# Patient Record
Sex: Female | Born: 1970 | Hispanic: No | Marital: Single | Smoking: Current every day smoker
Health system: Southern US, Community
[De-identification: ages and names within clinical notes are randomized; demographics above are authoritative.]

## PROBLEM LIST (undated history)

## (undated) DIAGNOSIS — F419 Anxiety disorder, unspecified: Secondary | ICD-10-CM

## (undated) DIAGNOSIS — I428 Other cardiomyopathies: Secondary | ICD-10-CM

## (undated) DIAGNOSIS — D649 Anemia, unspecified: Secondary | ICD-10-CM

## (undated) DIAGNOSIS — B3322 Viral myocarditis: Secondary | ICD-10-CM

## (undated) DIAGNOSIS — I10 Essential (primary) hypertension: Secondary | ICD-10-CM

## (undated) DIAGNOSIS — K861 Other chronic pancreatitis: Secondary | ICD-10-CM

## (undated) DIAGNOSIS — D7389 Other diseases of spleen: Secondary | ICD-10-CM

## (undated) DIAGNOSIS — E785 Hyperlipidemia, unspecified: Secondary | ICD-10-CM

## (undated) DIAGNOSIS — E538 Deficiency of other specified B group vitamins: Secondary | ICD-10-CM

## (undated) DIAGNOSIS — Z9289 Personal history of other medical treatment: Secondary | ICD-10-CM

## (undated) DIAGNOSIS — D735 Infarction of spleen: Secondary | ICD-10-CM

## (undated) HISTORY — DX: Hyperlipidemia, unspecified: E78.5

## (undated) HISTORY — DX: Other cardiomyopathies: I42.8

## (undated) HISTORY — DX: Personal history of other medical treatment: Z92.89

## (undated) HISTORY — DX: Viral myocarditis: B33.22

## (undated) HISTORY — DX: Anemia, unspecified: D64.9

## (undated) HISTORY — DX: Infarction of spleen: D73.5

## (undated) HISTORY — PX: CHOLECYSTECTOMY: SHX55

## (undated) HISTORY — DX: Other diseases of spleen: D73.89

## (undated) HISTORY — DX: Essential (primary) hypertension: I10

## (undated) HISTORY — DX: Deficiency of other specified B group vitamins: E53.8

## (undated) HISTORY — DX: Anxiety disorder, unspecified: F41.9

## (undated) HISTORY — DX: Other chronic pancreatitis: K86.1

---
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
## 1988-12-29 HISTORY — PX: WISDOM TOOTH EXTRACTION: SHX21

## 2016-03-17 LAB — LIPID PANEL
CHOLESTEROL: 314 — AB (ref 0–200)
HDL: 56 (ref 35–70)
Triglycerides: 656 — AB (ref 40–160)

## 2016-03-17 LAB — BASIC METABOLIC PANEL
BUN: 11 (ref 4–21)
Creatinine: 0.5 (ref 0.5–1.1)
GLUCOSE: 97
Potassium: 4.3 (ref 3.4–5.3)
Sodium: 144 (ref 137–147)

## 2016-03-17 LAB — HEPATIC FUNCTION PANEL
ALK PHOS: 44 (ref 25–125)
ALT: 19 (ref 7–35)
AST: 17 (ref 13–35)
Bilirubin, Total: 0.7

## 2016-09-22 LAB — LIPID PANEL: CHOLESTEROL: 400 — AB (ref 0–200)

## 2016-09-22 LAB — BASIC METABOLIC PANEL
BUN: 11 (ref 4–21)
Creatinine: 0.5 (ref 0.5–1.1)
Glucose: 104
POTASSIUM: 4 (ref 3.4–5.3)
SODIUM: 138 (ref 137–147)

## 2016-09-22 LAB — HEPATIC FUNCTION PANEL
ALT: 18 (ref 7–35)
AST: 15 (ref 13–35)
Alkaline Phosphatase: 49 (ref 25–125)
BILIRUBIN, TOTAL: 0.5

## 2017-02-19 ENCOUNTER — Encounter: Payer: Self-pay | Admitting: Registered Nurse

## 2017-02-19 ENCOUNTER — Ambulatory Visit: Payer: Self-pay | Admitting: Registered Nurse

## 2017-02-19 VITALS — BP 140/82 | HR 82 | Temp 98.4°F

## 2017-02-19 DIAGNOSIS — J019 Acute sinusitis, unspecified: Secondary | ICD-10-CM

## 2017-02-19 NOTE — Progress Notes (Signed)
0940: Pt c/o non-productive cough, itchy, red, watery eyes, nasal congestion x4 days. Afebrile. Believes sx allergy related. Recently moved to [place] from [facility]. No hx allergies. Started taking Zyrtec 2 days ago. Given 25mg  Benadryl for acute sx relief. Also given bottle saline nasal spray. Hypertensive currently at 140/90. Took meds 2 hours ago. Appt made to see NP this afternoon.  1230: Feels better Benadryl, just a little more tired. Hx reviewed. No acute problems r/t lengthy Hx (viral NICM, Pacreatitis, hyperlipidemia)

## 2017-02-19 NOTE — Patient Instructions (Addendum)
Nasal saline in the shower BID aggressive use and then prn 2 sprays each nostril every 2 hours while awake as needed for congestion flonase 1 spray each nostril twice a day Consider allegra/zyrtec/claritin OTC by mouth daily Trial phenylephrine 5mg  by mouth every 4-6hours as needed for runny nose If worsening sinus headache/ear pain after 48 hours fluticasone/saline nasal/phenylephrine then start amoxicillin 875mg  by mouth twice a day dispensed from [facility]

## 2017-02-19 NOTE — Progress Notes (Signed)
Subjective:    Patient ID: Joyce Cline, female    DOB: 1971-05-17, 46 y.o.   MRN: [id number]  Caucasian 45y/o female here for evaluation rhinitis, cough, sinus pressure.  Denied history of seasonal allergies but moved from [facility] to [place] last October this is her first spring in [place] after 14 years in [place].  Denied fever/chills/nausea/vomiting/rash/diarrhea/chest pain.  Intermittent headache.  Sputum clear.      Review of Systems  Constitutional: Negative for activity change, appetite change, chills, diaphoresis, fatigue, fever and unexpected weight change.  HENT: Positive for congestion, postnasal drip, rhinorrhea, sinus pain, sinus pressure, sneezing and sore throat. Negative for dental problem, drooling, ear discharge, ear pain, facial swelling, hearing loss, mouth sores, nosebleeds, tinnitus, trouble swallowing and voice change.   Eyes: Negative for photophobia, pain, discharge, redness, itching and visual disturbance.  Respiratory: Positive for cough. Negative for choking, chest tightness, shortness of breath, wheezing and stridor.   Cardiovascular: Negative for chest pain, palpitations and leg swelling.  Gastrointestinal: Negative for abdominal distention, abdominal pain, blood in stool, constipation, diarrhea, nausea and vomiting.  Endocrine: Negative for cold intolerance and heat intolerance.  Genitourinary: Negative for difficulty urinating, dysuria and hematuria.  Musculoskeletal: Negative for arthralgias, back pain, gait problem, joint swelling, myalgias, neck pain and neck stiffness.  Skin: Negative for color change, pallor, rash and wound.  Allergic/Immunologic: Negative for environmental allergies and food allergies.  Neurological: Positive for headaches. Negative for dizziness, tremors, seizures, syncope, facial asymmetry, speech difficulty, weakness, light-headedness and numbness.  Hematological: Negative for adenopathy. Does not bruise/bleed easily.  Psychiatric/Behavioral:  Negative for agitation, behavioral problems, confusion and sleep disturbance.       Objective:   Physical Exam  Constitutional: She is oriented to person, place, and time. She appears well-developed and well-nourished. She is active and cooperative.  Non-toxic appearance. She does not have a sickly appearance. She does not appear ill. No distress.  HENT:  Head: Normocephalic and atraumatic.  Right Ear: Hearing, external ear and ear canal normal. A middle ear effusion is present.  Left Ear: Hearing, external ear and ear canal normal. A middle ear effusion is present.  Nose: Mucosal edema and rhinorrhea present. No nose lacerations, sinus tenderness, nasal deformity, septal deviation or nasal septal hematoma. No epistaxis.  No foreign bodies. Right sinus exhibits no maxillary sinus tenderness and no frontal sinus tenderness. Left sinus exhibits no maxillary sinus tenderness and no frontal sinus tenderness.  Mouth/Throat: Uvula is midline and mucous membranes are normal. Mucous membranes are not pale, not dry and not cyanotic. She does not have dentures. No oral lesions. No trismus in the jaw. Normal dentition. No dental abscesses, uvula swelling, lacerations or dental caries. Posterior oropharyngeal edema and posterior oropharyngeal erythema present. No oropharyngeal exudate or tonsillar abscesses.  Mild pressure with sinus palpation denied pain; cobblestoning posterior pharynx mild oropharynx macular erythema; bilateral nasal turbinates edema/erythema clear/white discharge; bilateral TMs with air fluid level clear; bilateral allergic shiners  Eyes: Conjunctivae, EOM and lids are normal. Pupils are equal, round, and reactive to light. Right eye exhibits no chemosis, no discharge, no exudate and no hordeolum. No foreign body present in the right eye. Left eye exhibits no chemosis, no discharge, no exudate and no hordeolum. No foreign body present in the left eye. Right conjunctiva is not injected. Right  conjunctiva has no hemorrhage. Left conjunctiva is not injected. Left conjunctiva has no hemorrhage. No scleral icterus. Right eye exhibits normal extraocular motion and no nystagmus. Left eye exhibits normal extraocular  motion and no nystagmus. Right pupil is round and reactive. Left pupil is round and reactive. Pupils are equal.  Neck: Trachea normal and normal range of motion. Neck supple. No tracheal tenderness, no spinous process tenderness and no muscular tenderness present. No neck rigidity. No tracheal deviation, no edema, no erythema and normal range of motion present. No thyroid mass and no thyromegaly present.  Cardiovascular: Normal rate, regular rhythm, S1 normal, S2 normal, normal heart sounds and intact distal pulses.  PMI is not displaced.  Exam reveals no gallop and no friction rub.   No murmur heard. Pulmonary/Chest: Effort normal and breath sounds normal. No accessory muscle usage or stridor. No respiratory distress. She has no decreased breath sounds. She has no wheezes. She has no rhonchi. She has no rales. She exhibits no tenderness.  Speaks full sentences no cough observed  Abdominal: Soft. She exhibits no distension.  Musculoskeletal: Normal range of motion. She exhibits no edema or tenderness.       Right shoulder: Normal.       Left shoulder: Normal.       Right hip: Normal.       Left hip: Normal.       Right knee: Normal.       Left knee: Normal.       Cervical back: Normal.       Right hand: Normal.       Left hand: Normal.  Lymphadenopathy:       Head (right side): No submental, no submandibular, no tonsillar, no preauricular, no posterior auricular and no occipital adenopathy present.       Head (left side): No submental, no submandibular, no tonsillar, no preauricular, no posterior auricular and no occipital adenopathy present.    She has no cervical adenopathy.       Right cervical: No superficial cervical, no deep cervical and no posterior cervical adenopathy  present.      Left cervical: No superficial cervical, no deep cervical and no posterior cervical adenopathy present.  Neurological: She is alert and oriented to person, place, and time. She has normal strength. She is not disoriented. She displays no atrophy and no tremor. No cranial nerve deficit or sensory deficit. She exhibits normal muscle tone. She displays no seizure activity. Coordination and gait normal. GCS eye subscore is 4. GCS verbal subscore is 5. GCS motor subscore is 6.  Skin: Skin is warm, dry and intact. No abrasion, no bruising, no burn, no ecchymosis, no laceration, no lesion, no petechiae and no rash noted. She is not diaphoretic. No cyanosis or erythema. No pallor. Nails show no clubbing.  Psychiatric: She has a normal mood and affect. Her speech is normal and behavior is normal. Judgment and thought content normal. Cognition and memory are normal.  Nursing note and vitals reviewed.         Assessment & Plan:  A-acute rhinosinusitis  Suspect viral illness as many coworkers sick with similar symptoms but spring bloom in full local area could also be allergies will treat for both with flonase/saline/zyrtec/shower twice a day.  Consider singulair 10mg  po qhs if no improvement with current plan of care x 1 week.  Patient verbalized understanding information/instructions, agreed with plan of care and had no further questions at this time.  Supportive treatment.   No evidence of invasive bacterial infection, non toxic and well hydrated.  This is most likely self limiting viral infection.  I do not see where any further testing or imaging is necessary  at this time.   I will suggest supportive care, rest, good hygiene and encourage the patient to take adequate fluids.  The patient is to return to clinic or EMERGENCY ROOM if symptoms worsen or change significantly e.g. ear pain, fever, purulent discharge from ears or bleeding.  Start amoxicillin 875mg  po BID x 10 days #20 RF0 from [facility]  if worsening ear pain/discharge/fever.  Patient verbalized agreement and understanding of treatment plan and had no further questions at this time     Suspect Viral illness: no evidence of invasive bacterial infection, non toxic and well hydrated.  This is most likely self limiting viral infection.  I do not see where any further testing or imaging is necessary at this time.   I will suggest supportive care, rest, good hygiene and encourage the patient to take adequate fluids.  Does not require work excuse.   Avoid Sudafed due to hypertension.  May continue mucinex if chest congestion.  Phenylephrine 5mg  po q4-6 prn rhinitis given 4 packages from clinic stock.  Discussed with patient can cause drowsiness.  Recommend tylenol 1000mg  po QID prn pain/fever as motrin/advil/aleve/naproxen can elevate blood pressure/counteract her medications.nasal saline 1-2 sprays each nostril prn q2h, motrin 800mg  po TID prn.  Discussed honey with lemon and salt water gargles for comfort also.  The patient is to return to clinic or EMERGENCY ROOM if symptoms worsen or change significantly e.g. fever, lethargy, SOB, wheezing.  Patient verbalized agreement and understanding of treatment plan.    start flonase 1 spray each nostril BID #1 RF0 Rx given from [facility], saline 2 sprays each nostril q2h prn congestion given 1 UD bottle from clinic stock.  If no improvement with 48 hours of saline and flonase use start amoxicillin 875mg  po BID x 10 days.  Rx given from [facility] .  No evidence of systemic bacterial infection, non toxic and well hydrated.  I do not see where any further testing or imaging is necessary at this time.   I will suggest supportive care, rest, good hygiene and encourage the patient to take adequate fluids.  The patient is to return to clinic or EMERGENCY ROOM if symptoms worsen or change significantly.  Exitcare handout on sinusitis given to patient.  Patient verbalized agreement and understanding of treatment plan and had  no further questions at this time.   P2:  Hand washing and cover cough  Usually no specific medical treatment is needed if a virus is causing the sore throat.  The throat most often gets better on its own within 5 to 7 days.  Antibiotic medicine does not cure viral pharyngitis.   For acute pharyngitis caused by bacteria, your healthcare provider will prescribe an antibiotic.  Marland Kitchen Do not smoke.  Marland Kitchen Avoid secondhand smoke and other air pollutants.  . Use a cool mist humidifier to add moisture to the air.  . Get plenty of rest.  . You may want to rest your throat by talking less and eating a diet that is mostly liquid or soft for a day or two.   Marland Kitchen Nonprescription throat lozenges and mouthwashes should help relieve the soreness.   . Gargling with warm saltwater and drinking warm liquids may help.  (You can make a saltwater solution by adding 1/4 teaspoon of salt to 8 ounces, or 240 mL, of warm water.)  . A nonprescription pain reliever such as aspirin, acetaminophen, or ibuprofen may ease general aches and pains.   FOLLOW UP with clinic provider if no improvements  in the next 7-10 days.  Patient verbalized understanding of instructions and agreed with plan of care. P2:  Hand washing and diet.

## 2017-05-07 ENCOUNTER — Encounter (HOSPITAL_COMMUNITY): Payer: Self-pay

## 2017-05-07 ENCOUNTER — Telehealth: Payer: Self-pay | Admitting: *Deleted

## 2017-05-07 ENCOUNTER — Emergency Department (HOSPITAL_COMMUNITY): Payer: No Typology Code available for payment source

## 2017-05-07 ENCOUNTER — Ambulatory Visit: Payer: Self-pay | Admitting: Physician Assistant

## 2017-05-07 ENCOUNTER — Inpatient Hospital Stay (HOSPITAL_COMMUNITY)
Admission: EM | Admit: 2017-05-07 | Discharge: 2017-05-14 | DRG: 439 | Disposition: A | Discharge status: Home / Self Care | Payer: No Typology Code available for payment source | Attending: Internal Medicine | Admitting: Internal Medicine

## 2017-05-07 ENCOUNTER — Telehealth: Payer: Self-pay | Admitting: General Practice

## 2017-05-07 ENCOUNTER — Observation Stay (HOSPITAL_COMMUNITY): Payer: No Typology Code available for payment source

## 2017-05-07 ENCOUNTER — Telehealth: Payer: Self-pay | Admitting: Physician Assistant

## 2017-05-07 DIAGNOSIS — E781 Pure hyperglyceridemia: Secondary | ICD-10-CM | POA: Diagnosis present

## 2017-05-07 DIAGNOSIS — K3189 Other diseases of stomach and duodenum: Secondary | ICD-10-CM

## 2017-05-07 DIAGNOSIS — K2970 Gastritis, unspecified, without bleeding: Secondary | ICD-10-CM | POA: Diagnosis present

## 2017-05-07 DIAGNOSIS — R935 Abnormal findings on diagnostic imaging of other abdominal regions, including retroperitoneum: Secondary | ICD-10-CM

## 2017-05-07 DIAGNOSIS — K859 Acute pancreatitis without necrosis or infection, unspecified: Secondary | ICD-10-CM

## 2017-05-07 DIAGNOSIS — R109 Unspecified abdominal pain: Secondary | ICD-10-CM | POA: Diagnosis not present

## 2017-05-07 DIAGNOSIS — D638 Anemia in other chronic diseases classified elsewhere: Secondary | ICD-10-CM

## 2017-05-07 DIAGNOSIS — I509 Heart failure, unspecified: Secondary | ICD-10-CM | POA: Diagnosis present

## 2017-05-07 DIAGNOSIS — M6282 Rhabdomyolysis: Secondary | ICD-10-CM | POA: Diagnosis present

## 2017-05-07 DIAGNOSIS — R0602 Shortness of breath: Secondary | ICD-10-CM

## 2017-05-07 DIAGNOSIS — K219 Gastro-esophageal reflux disease without esophagitis: Secondary | ICD-10-CM | POA: Diagnosis present

## 2017-05-07 DIAGNOSIS — R1909 Other intra-abdominal and pelvic swelling, mass and lump: Secondary | ICD-10-CM | POA: Diagnosis present

## 2017-05-07 DIAGNOSIS — F17200 Nicotine dependence, unspecified, uncomplicated: Secondary | ICD-10-CM | POA: Diagnosis present

## 2017-05-07 DIAGNOSIS — K861 Other chronic pancreatitis: Secondary | ICD-10-CM | POA: Diagnosis present

## 2017-05-07 DIAGNOSIS — I110 Hypertensive heart disease with heart failure: Secondary | ICD-10-CM | POA: Diagnosis present

## 2017-05-07 DIAGNOSIS — R1012 Left upper quadrant pain: Secondary | ICD-10-CM | POA: Diagnosis not present

## 2017-05-07 DIAGNOSIS — I428 Other cardiomyopathies: Secondary | ICD-10-CM

## 2017-05-07 DIAGNOSIS — E876 Hypokalemia: Secondary | ICD-10-CM | POA: Diagnosis present

## 2017-05-07 DIAGNOSIS — K858 Other acute pancreatitis without necrosis or infection: Secondary | ICD-10-CM | POA: Diagnosis not present

## 2017-05-07 DIAGNOSIS — Z79899 Other long term (current) drug therapy: Secondary | ICD-10-CM

## 2017-05-07 DIAGNOSIS — D509 Iron deficiency anemia, unspecified: Secondary | ICD-10-CM | POA: Diagnosis present

## 2017-05-07 LAB — COMPREHENSIVE METABOLIC PANEL
ALT: 19 U/L (ref 14–54)
AST: 16 U/L (ref 15–41)
Albumin: 4.1 g/dL (ref 3.5–5.0)
Alkaline Phosphatase: 72 U/L (ref 38–126)
Anion gap: 13 (ref 5–15)
BUN: 10 mg/dL (ref 6–20)
CALCIUM: 10 mg/dL (ref 8.9–10.3)
CO2: 22 mmol/L (ref 22–32)
CREATININE: 0.5 mg/dL (ref 0.44–1.00)
Chloride: 103 mmol/L (ref 101–111)
Glucose, Bld: 96 mg/dL (ref 65–99)
Potassium: 3.9 mmol/L (ref 3.5–5.1)
Sodium: 138 mmol/L (ref 135–145)
Total Bilirubin: 0.8 mg/dL (ref 0.3–1.2)
Total Protein: 8 g/dL (ref 6.5–8.1)

## 2017-05-07 LAB — CBC
HCT: 33 % — ABNORMAL LOW (ref 36.0–46.0)
Hemoglobin: 11.2 g/dL — ABNORMAL LOW (ref 12.0–15.0)
MCH: 33 pg (ref 26.0–34.0)
MCHC: 33.9 g/dL (ref 30.0–36.0)
MCV: 97.3 fL (ref 78.0–100.0)
PLATELETS: 316 10*3/uL (ref 150–400)
RBC: 3.39 MIL/uL — AB (ref 3.87–5.11)
RDW: 12.9 % (ref 11.5–15.5)
WBC: 9.8 10*3/uL (ref 4.0–10.5)

## 2017-05-07 LAB — URINALYSIS, ROUTINE W REFLEX MICROSCOPIC
Bilirubin Urine: NEGATIVE
GLUCOSE, UA: NEGATIVE mg/dL
Hgb urine dipstick: NEGATIVE
KETONES UR: 20 mg/dL — AB
LEUKOCYTES UA: NEGATIVE
Nitrite: NEGATIVE
PH: 5 (ref 5.0–8.0)
Protein, ur: NEGATIVE mg/dL
Specific Gravity, Urine: 1.017 (ref 1.005–1.030)

## 2017-05-07 LAB — I-STAT BETA HCG BLOOD, ED (MC, WL, AP ONLY): I-stat hCG, quantitative: <5 m[IU]/mL (ref <5)

## 2017-05-07 LAB — LIPASE, BLOOD: Lipase: 27 U/L (ref 11–51)

## 2017-05-07 MED ORDER — SODIUM CHLORIDE 0.9 % IV SOLN
INTRAVENOUS | Status: AC
  Administered 2017-05-08 (×2): via INTRAVENOUS

## 2017-05-07 MED ORDER — LORAZEPAM 1 MG PO TABS: 1.0000 mg | ORAL_TABLET | Freq: Four times a day (QID) | ORAL | Status: DC | PRN

## 2017-05-07 MED ORDER — ONDANSETRON HCL 4 MG/2ML IJ SOLN
4.0000 mg | Freq: Once | INTRAMUSCULAR | Status: AC
  Administered 2017-05-07: 4 mg via INTRAVENOUS
  Filled 2017-05-07: qty 2

## 2017-05-07 MED ORDER — ONDANSETRON HCL 4 MG PO TABS
4.0000 mg | ORAL_TABLET | Freq: Four times a day (QID) | ORAL | Status: DC | PRN
  Administered 2017-05-09: 4 mg via ORAL
  Filled 2017-05-07 (×2): qty 1

## 2017-05-07 MED ORDER — HYDROMORPHONE HCL 1 MG/ML IJ SOLN
0.5000 mg | INTRAMUSCULAR | Status: DC | PRN
  Administered 2017-05-08 – 2017-05-11 (×19): 0.5 mg via INTRAVENOUS
  Filled 2017-05-07 (×19): qty 1

## 2017-05-07 MED ORDER — LORAZEPAM 2 MG/ML IJ SOLN: 0.0000 mg | Freq: Two times a day (BID) | INTRAMUSCULAR | Status: DC

## 2017-05-07 MED ORDER — THIAMINE HCL 100 MG/ML IJ SOLN
100.0000 mg | Freq: Every day | INTRAMUSCULAR | Status: DC
  Administered 2017-05-08: 100 mg via INTRAVENOUS
  Filled 2017-05-07 (×2): qty 2

## 2017-05-07 MED ORDER — VITAMIN B-1 100 MG PO TABS
100.0000 mg | ORAL_TABLET | Freq: Every day | ORAL | Status: DC
  Administered 2017-05-09 – 2017-05-14 (×6): 100 mg via ORAL
  Filled 2017-05-07 (×6): qty 1

## 2017-05-07 MED ORDER — LORAZEPAM 2 MG/ML IJ SOLN
1.0000 mg | Freq: Four times a day (QID) | INTRAMUSCULAR | Status: DC | PRN
  Filled 2017-05-07: qty 1

## 2017-05-07 MED ORDER — FOLIC ACID 1 MG PO TABS
1.0000 mg | ORAL_TABLET | Freq: Every day | ORAL | Status: DC
  Administered 2017-05-08 – 2017-05-14 (×7): 1 mg via ORAL
  Filled 2017-05-07 (×7): qty 1

## 2017-05-07 MED ORDER — ACETAMINOPHEN 325 MG PO TABS
650.0000 mg | ORAL_TABLET | Freq: Four times a day (QID) | ORAL | Status: DC | PRN
  Administered 2017-05-08: 650 mg via ORAL
  Filled 2017-05-07: qty 2

## 2017-05-07 MED ORDER — ADULT MULTIVITAMIN W/MINERALS CH
1.0000 | ORAL_TABLET | Freq: Every day | ORAL | Status: DC
  Administered 2017-05-08 – 2017-05-14 (×7): 1 via ORAL
  Filled 2017-05-07 (×7): qty 1

## 2017-05-07 MED ORDER — LORAZEPAM 2 MG/ML IJ SOLN
0.0000 mg | Freq: Four times a day (QID) | INTRAMUSCULAR | Status: DC
  Administered 2017-05-08: 2 mg via INTRAVENOUS

## 2017-05-07 MED ORDER — SODIUM CHLORIDE 0.9 % IV BOLUS (SEPSIS)
1000.0000 mL | Freq: Once | INTRAVENOUS | Status: AC
  Administered 2017-05-07: 1000 mL via INTRAVENOUS

## 2017-05-07 MED ORDER — CARVEDILOL 25 MG PO TABS
25.0000 mg | ORAL_TABLET | Freq: Two times a day (BID) | ORAL | Status: DC
  Administered 2017-05-08 – 2017-05-14 (×14): 25 mg via ORAL
  Filled 2017-05-07 (×14): qty 1

## 2017-05-07 MED ORDER — IOPAMIDOL (ISOVUE-300) INJECTION 61%
INTRAVENOUS | Status: AC
  Administered 2017-05-07: 100 mL via INTRAVENOUS
  Filled 2017-05-07: qty 100

## 2017-05-07 MED ORDER — PANTOPRAZOLE SODIUM 40 MG IV SOLR
40.0000 mg | Freq: Once | INTRAVENOUS | Status: AC
  Administered 2017-05-07: 40 mg via INTRAVENOUS
  Filled 2017-05-07: qty 40

## 2017-05-07 MED ORDER — ACETAMINOPHEN 650 MG RE SUPP: 650.0000 mg | Freq: Four times a day (QID) | RECTAL | Status: DC | PRN

## 2017-05-07 MED ORDER — MAGNESIUM OXIDE 400 (241.3 MG) MG PO TABS
400.0000 mg | ORAL_TABLET | Freq: Every day | ORAL | Status: DC
  Administered 2017-05-08 – 2017-05-11 (×5): 400 mg via ORAL
  Filled 2017-05-07 (×6): qty 1

## 2017-05-07 MED ORDER — HYDROMORPHONE HCL 1 MG/ML IJ SOLN
1.0000 mg | Freq: Once | INTRAMUSCULAR | Status: AC
  Administered 2017-05-07: 1 mg via INTRAVENOUS

## 2017-05-07 MED ORDER — HYDROMORPHONE HCL 1 MG/ML IJ SOLN
1.0000 mg | Freq: Once | INTRAMUSCULAR | Status: DC
Start: 2017-05-07 — End: 2017-05-07
  Filled 2017-05-07: qty 1

## 2017-05-07 MED ORDER — ONDANSETRON HCL 4 MG/2ML IJ SOLN
4.0000 mg | Freq: Four times a day (QID) | INTRAMUSCULAR | Status: DC | PRN
  Administered 2017-05-11: 4 mg via INTRAVENOUS
  Filled 2017-05-07: qty 2

## 2017-05-07 MED ORDER — METOCLOPRAMIDE HCL 5 MG/ML IJ SOLN
10.0000 mg | Freq: Once | INTRAMUSCULAR | Status: AC
  Administered 2017-05-07: 10 mg via INTRAVENOUS
  Filled 2017-05-07: qty 2

## 2017-05-07 MED ORDER — HYDROMORPHONE HCL 1 MG/ML IJ SOLN
1.0000 mg | Freq: Once | INTRAMUSCULAR | Status: AC
  Administered 2017-05-07: 1 mg via INTRAVENOUS
  Filled 2017-05-07: qty 1

## 2017-05-07 MED ORDER — PANTOPRAZOLE SODIUM 40 MG IV SOLR
40.0000 mg | Freq: Two times a day (BID) | INTRAVENOUS | Status: DC
  Administered 2017-05-08 (×3): 40 mg via INTRAVENOUS
  Filled 2017-05-07 (×3): qty 40

## 2017-05-07 MED ORDER — HYDRALAZINE HCL 20 MG/ML IJ SOLN: 10.0000 mg | INTRAMUSCULAR | Status: DC | PRN

## 2017-05-07 NOTE — Telephone Encounter (Signed)
Patient called to request a np appointment. She explains pains in her side and fears diverticulitis. She has an extensive medical history. I spoke to [name] and [name], who instructed me to triage the patient. I have scheduled an appointment for tomorrow afternoon as well with [name]

## 2017-05-07 NOTE — Telephone Encounter (Signed)
Noted  

## 2017-05-07 NOTE — Telephone Encounter (Signed)
Discussed patient with Dr. [name]. Based on information received from [facility], we recommend that patient immediately go to the emergency room instead of coming in for an outpatient office visit with [place].

## 2017-05-07 NOTE — Telephone Encounter (Signed)
Will forward to [employer] as Lorain Childes

## 2017-05-07 NOTE — ED Provider Notes (Signed)
[facility]-EMERGENCY DEPT Provider Note   CSN: [id number] Arrival date & time: 05/07/17  1548     History   Chief Complaint Chief Complaint  Patient presents with  . Abdominal Pain    HPI Joyce Cline is a 46 y.o. female.   Abdominal Pain   This is a new problem. The current episode started more than 2 days ago. The problem occurs constantly. The problem has been gradually worsening. The pain is located in the LUQ. The pain is severe. Associated symptoms include nausea. Pertinent negatives include vomiting, constipation, dysuria and hematuria. The symptoms are aggravated by deep breathing and certain positions. The symptoms are relieved by being still. Past workup does not include GI consult or CT scan. Her past medical history is significant for GERD.    Past Medical History:  Diagnosis Date  . H/O cesarean section   . History of blood transfusion   . History of plasmapheresis   . Hyperlipidemia   . Hypertension   . NICM (nonischemic cardiomyopathy) (HCC)   . Pancreatitis   . Wisdom teeth extracted     Patient Active Problem List   Diagnosis Date Noted  . Abdominal pain 05/07/2017  . Hypertriglyceridemia 05/07/2017  . NICM (nonischemic cardiomyopathy) (HCC) 05/07/2017  . History of pancreatitis 05/07/2017    Past Surgical History:  Procedure Laterality Date  . CHOLECYSTECTOMY      OB History    No data available       Home Medications    Prior to Admission medications   Medication Sig Start Date End Date Taking? Authorizing Provider  amLODipine (NORVASC) 5 MG tablet Take 5 mg by mouth daily.   Yes [provider]  carvedilol (COREG) 25 MG tablet Take 25 mg by mouth 2 (two) times daily.   Yes [provider]  Chromium 1000 MCG TABS Take 1,000 mcg by mouth daily.    Yes [provider]  CREON 6000 units CPEP Take 6,000-12,000 Units by mouth 3 (three) times daily with meals.    Yes [provider]  Cyanocobalamin  (VITAMIN B-12) 6000 MCG SUBL Place 6,000 mcg under the tongue daily.   Yes [provider]  ibuprofen (ADVIL,MOTRIN) 200 MG tablet Take 600 mg by mouth every 6 (six) hours as needed for headache, mild pain or moderate pain.   Yes [provider]  Magnesium 500 MG TABS Take 500 mg by mouth at bedtime.   Yes [provider]  Multiple Vitamin (MULTIVITAMIN WITH MINERALS) TABS tablet Take 1 tablet by mouth daily.   Yes [provider]  omega-3 acid ethyl esters (LOVAZA) 1 g capsule Take 1 g by mouth daily.   Yes [provider]  traMADol (ULTRAM) 50 MG tablet Take 50 mg by mouth every 6 (six) hours as needed for moderate pain.    Yes [provider]  Turmeric 500 MG CAPS Take 500 mg by mouth 2 (two) times daily.   Yes [provider]    Family History Family History  Problem Relation Age of Onset  . Uterine cancer Maternal Grandmother     Social History Social History  Substance Use Topics  . Smoking status: Current Every Day Smoker  . Smokeless tobacco: Never Used  . Alcohol use Yes     Comment: ? daily     Allergies   Patient has no known allergies.   Review of Systems Review of Systems  Gastrointestinal: Positive for abdominal pain and nausea. Negative for constipation and vomiting.  Genitourinary: Negative for dysuria and hematuria.  All other systems reviewed and are negative.    Physical Exam Updated Vital Signs BP 140/78 (BP Location: Right Arm)   Pulse 90   Temp 98 F (36.7 C) (Oral)   Resp 20   Wt 165 lb (74.8 kg)   LMP 04/25/2017 (Approximate) Comment: no chance of preg per patient, IUD  SpO2 99%   Physical Exam  Constitutional: She is oriented to person, place, and time. She appears well-developed and well-nourished.  HENT:  Head: Normocephalic and atraumatic.  Eyes: Conjunctivae and EOM are normal.  Neck: Normal range of motion.  Cardiovascular: Normal rate and regular rhythm.     Pulmonary/Chest: Effort normal and breath sounds normal. No stridor. No respiratory distress.  Abdominal: Soft. She exhibits no distension. There is tenderness (significant to luq).  Musculoskeletal: Normal range of motion. She exhibits no edema or deformity.  Neurological: She is alert and oriented to person, place, and time. No cranial nerve deficit. Coordination normal.  Skin: Skin is warm and dry.  Nursing note and vitals reviewed.    ED Treatments / Results  Labs (all labs ordered are listed, but only abnormal results are displayed) Labs Reviewed  CBC - Abnormal; Notable for the following:       Result Value   RBC 3.39 (*)    Hemoglobin 11.2 (*)    HCT 33.0 (*)    All other components within normal limits  URINALYSIS, ROUTINE W REFLEX MICROSCOPIC - Abnormal; Notable for the following:    APPearance HAZY (*)    Ketones, ur 20 (*)    All other components within normal limits  LIPASE, BLOOD  COMPREHENSIVE METABOLIC PANEL  HEPATIC FUNCTION PANEL  MAGNESIUM  HIV ANTIBODY (ROUTINE TESTING)  BASIC METABOLIC PANEL  CBC  I-STAT BETA HCG BLOOD, ED ([facility], [facility], [facility] ONLY)    EKG  EKG Interpretation None       Radiology Ct Abdomen Pelvis W Contrast  Result Date: 05/07/2017 CLINICAL DATA:  Acute onset of generalized abdominal pain and distention. Nausea. Initial encounter. EXAM: CT ABDOMEN AND PELVIS WITH CONTRAST TECHNIQUE: Multidetector CT imaging of the abdomen and pelvis was performed using the standard protocol following bolus administration of intravenous contrast. CONTRAST:  100 mL ISOVUE-300 IOPAMIDOL (ISOVUE-300) INJECTION 61% COMPARISON:  None. FINDINGS: Lower chest: The visualized lung bases are grossly clear. The visualized portions of the mediastinum are unremarkable. Hepatobiliary: The liver is unremarkable in appearance. The patient is status post cholecystectomy, with clips noted at the gallbladder fossa. The common bile duct remains normal in caliber. Pancreas: The  pancreas is grossly unremarkable in appearance. However, a vague hypoattenuating masslike density is noted anterior to the distal pancreatic body, described in further detail below. Spleen: The spleen is unremarkable in appearance. Adrenals/Urinary Tract: The adrenal glands are unremarkable in appearance. The kidneys are within normal limits. There is no evidence of hydronephrosis. No renal or ureteral stones are identified. No perinephric stranding is seen. Stomach/Bowel: There is a heterogeneous hypoattenuating 7.6 x 7.1 x 4.4 cm masslike density posterior to the body of the stomach, with poor distinction of the gastric wall. Mild surrounding soft tissue inflammation is seen, with mildly prominent nodes measuring up to 1.0 cm in short axis. It abuts the pancreas, but appears to be separate from the pancreas. This is most concerning for a gastric malignancy, such as a GI stromal tumor, though a severe gastric ulceration might have a similar appearance. The remainder of the stomach is grossly unremarkable  in appearance. The small bowel is grossly unremarkable. The appendix is normal in caliber, without evidence for appendicitis. Scattered diverticulosis is noted along the ascending, descending and sigmoid colon, without evidence of diverticulitis. Vascular/Lymphatic: Minimal calcification is seen along the abdominal aorta and its branches. No retroperitoneal or pelvic sidewall lymphadenopathy is seen. Reproductive: The bladder is mildly distended and grossly unremarkable. The uterus is unremarkable in appearance. An intrauterine device is noted in expected position at the fundus of the uterus. The ovaries are relatively symmetric, aside from a 2.6 cm left-sided follicle. Other: No additional soft tissue abnormalities are seen. Musculoskeletal: No acute osseous abnormalities are identified. The visualized musculature is unremarkable in appearance. IMPRESSION: 1. Heterogeneous hypoattenuating 7.6 x 7.1 x 4.4 cm  masslike density posterior to the body of the stomach, with poor distinction of the gastric wall. Mild surrounding soft tissue inflammation, with mildly prominent nodes measuring up to 1.0 cm in short axis. This is most concerning for gastric malignancy, such as a GI stromal tumor, though a severe gastric ulceration might have a similar appearance. Endoscopy is recommended for further evaluation. 2. Scattered diverticulosis along the ascending, descending and sigmoid colon, without evidence of diverticulitis. Electronically Signed   By: Roanna Raider M.D.   On: 05/07/2017 18:34   Dg Chest Port 1 View  Result Date: 05/07/2017 CLINICAL DATA:  Shortness of breath. EXAM: PORTABLE CHEST 1 VIEW COMPARISON:  None. FINDINGS: The cardiomediastinal contours are normal. The lungs are clear. Pulmonary vasculature is normal. No consolidation, pleural effusion, or pneumothorax. No acute osseous abnormalities are seen. IMPRESSION: No acute pulmonary process. Electronically Signed   By: Rubye Oaks M.D.   On: 05/07/2017 22:46    Procedures Procedures (including critical care time)  Medications Ordered in ED Medications  magnesium oxide (MAG-OX) tablet 400 mg (not administered)  carvedilol (COREG) tablet 25 mg (not administered)  hydrALAZINE (APRESOLINE) injection 10 mg (not administered)  pantoprazole (PROTONIX) injection 40 mg (not administered)  HYDROmorphone (DILAUDID) injection 0.5 mg (not administered)  LORazepam (ATIVAN) tablet 1 mg (not administered)    Or  LORazepam (ATIVAN) injection 1 mg (not administered)  thiamine (VITAMIN B-1) tablet 100 mg (not administered)    Or  thiamine (B-1) injection 100 mg (not administered)  folic acid (FOLVITE) tablet 1 mg (not administered)  multivitamin with minerals tablet 1 tablet (not administered)  acetaminophen (TYLENOL) tablet 650 mg (not administered)    Or  acetaminophen (TYLENOL) suppository 650 mg (not administered)  LORazepam (ATIVAN) injection  0-4 mg (not administered)    Followed by  LORazepam (ATIVAN) injection 0-4 mg (not administered)  0.9 %  sodium chloride infusion (not administered)  ondansetron (ZOFRAN) tablet 4 mg (not administered)    Or  ondansetron (ZOFRAN) injection 4 mg (not administered)  HYDROmorphone (DILAUDID) injection 1 mg (1 mg Intravenous Given 05/07/17 1735)  sodium chloride 0.9 % bolus 1,000 mL (0 mLs Intravenous Stopped 05/07/17 1920)  ondansetron (ZOFRAN) injection 4 mg (4 mg Intravenous Given 05/07/17 1735)  iopamidol (ISOVUE-300) 61 % injection (100 mLs Intravenous Contrast Given 05/07/17 1753)  ondansetron (ZOFRAN) injection 4 mg (4 mg Intravenous Given 05/07/17 1959)  HYDROmorphone (DILAUDID) injection 1 mg (1 mg Intravenous Given 05/07/17 1958)  pantoprazole (PROTONIX) injection 40 mg (40 mg Intravenous Given 05/07/17 2006)  metoCLOPramide (REGLAN) injection 10 mg (10 mg Intravenous Given 05/07/17 2006)     Initial Impression / Assessment and Plan / ED Course  I have reviewed the triage vital signs and the nursing notes.  Pertinent labs &  imaging results that were available during my care of the patient were reviewed by me and considered in my medical decision making (see chart for details).    Has a mass concerning for stromal tumor behind her stomach is a likely cause for her symptoms. She is in significant pain which is temporarily relieved by IV pain medication. Discussed the case with on-call gastroenterology who recommended admission for pain control and endoscopy in the morning. They would see her in the morning.   Final Clinical Impressions(s) / ED Diagnoses   Final diagnoses:  SOB (shortness of breath)      Staceyann Knouff, Barbara Cower, MD 05/07/17 [phone]

## 2017-05-07 NOTE — Telephone Encounter (Signed)
Noted. Attempt by Corky Mull, LPN to reach patient

## 2017-05-07 NOTE — Telephone Encounter (Signed)
Left message on voicemail to call office. Per Lelon Mast pt needs to go to the ED.

## 2017-05-07 NOTE — Telephone Encounter (Signed)
Correction- I spoke to [name] rather than [name] and [name]  Addendum: I did transfer the caller to team health for triage to be completed.

## 2017-05-07 NOTE — H&P (Signed)
History and Physical    Joyce Cline UJW:[id number] DOB: 1971/02/23 DOA: 05/07/2017  PCP: Patient, No Pcp Per  Patient coming from: Home.  Chief Complaint: Abdominal pain.  HPI: Joyce Cline is a 46 y.o. female with history of chronic pancreatitis secondary to hypertriglyceridemia previously requiring plasmapheresis who had just moved from [place] to [place] 6 months ago presents to the ER with complaints of abdominal pain. Patient has been having left upper quadrant pain stabbing in nature radiating to the back. Last 4 days. Has been having nausea denies vomiting has been having some loose stools. Denies any fever or chills or any recent sick contacts or use of antibiotics.   ED Course: In the ER labs revealed mild anemia. CT scan of the abdomen shows gastric mass. ER physician had discussed with Dr. Fritzi Mandes, on-call [place] gastroenterologist will be seeing patient in consult for endoscopy. Patient is being admitted for further management. Patient otherwise denies any chest pain but has been having some difficulty breathing from the abdominal discomfort. Patient is not hypoxic.  Review of Systems: As per HPI, rest all negative.   Past Medical History:  Diagnosis Date  . H/O cesarean section   . History of blood transfusion   . History of plasmapheresis   . Hyperlipidemia   . Hypertension   . NICM (nonischemic cardiomyopathy) (HCC)   . Pancreatitis   . Wisdom teeth extracted     Past Surgical History:  Procedure Laterality Date  . CHOLECYSTECTOMY       reports that she has been smoking.  She has never used smokeless tobacco. She reports that she drinks alcohol. She reports that she does not use drugs.  No Known Allergies  Family History  Problem Relation Age of Onset  . Uterine cancer Maternal Grandmother     Prior to Admission medications   Medication Sig Start Date End Date Taking? Authorizing Provider  amLODipine (NORVASC) 5 MG tablet Take 5  mg by mouth daily.   Yes [provider]  carvedilol (COREG) 25 MG tablet Take 25 mg by mouth 2 (two) times daily.   Yes [provider]  Chromium 1000 MCG TABS Take 1,000 mcg by mouth daily.    Yes [provider]  CREON 6000 units CPEP Take 6,000-12,000 Units by mouth 3 (three) times daily with meals.    Yes [provider]  Cyanocobalamin (VITAMIN B-12) 6000 MCG SUBL Place 6,000 mcg under the tongue daily.   Yes [provider]  ibuprofen (ADVIL,MOTRIN) 200 MG tablet Take 600 mg by mouth every 6 (six) hours as needed for headache, mild pain or moderate pain.   Yes [provider]  Magnesium 500 MG TABS Take 500 mg by mouth at bedtime.   Yes [provider]  Multiple Vitamin (MULTIVITAMIN WITH MINERALS) TABS tablet Take 1 tablet by mouth daily.   Yes [provider]  omega-3 acid ethyl esters (LOVAZA) 1 g capsule Take 1 g by mouth daily.   Yes [provider]  traMADol (ULTRAM) 50 MG tablet Take 50 mg by mouth every 6 (six) hours as needed for moderate pain.    Yes [provider]  Turmeric 500 MG CAPS Take 500 mg by mouth 2 (two) times daily.   Yes [provider]    Physical Exam: Vitals:   05/07/17 1551 05/07/17 1921  BP: (!) 145/87 132/88  Pulse: 96 84  Resp: 18 18  Temp: 98.2 F (36.8 C) 97.9 F (36.6 C)  TempSrc: Oral  Oral  SpO2: 96% 96%  Weight: 74.8 kg (165 lb)       Constitutional: Moderately built and nourished. Vitals:   05/07/17 1551 05/07/17 1921  BP: (!) 145/87 132/88  Pulse: 96 84  Resp: 18 18  Temp: 98.2 F (36.8 C) 97.9 F (36.6 C)  TempSrc: Oral Oral  SpO2: 96% 96%  Weight: 74.8 kg (165 lb)    Eyes: Anicteric no pallor. ENMT: No discharge from the ears eyes nose and mouth. Neck: No mass felt. No neck rigidity. No JVD appreciated. Respiratory: No rhonchi or crepitations. Cardiovascular: S1 and S2 heard no murmurs appreciated. Abdomen: Soft nontender  bowel sounds present. Musculoskeletal: No edema. No joint effusion. Skin: No rash. Skin appears warm. Neurologic: Alert awake oriented to time place and person. Moves all extremities. Psychiatric: Appears normal. Normal affect.   Labs on Admission: I have personally reviewed following labs and imaging studies  CBC:  Recent Labs Lab 05/07/17 1557  WBC 9.8  HGB 11.2*  HCT 33.0*  MCV 97.3  PLT 316   Basic Metabolic Panel:  Recent Labs Lab 05/07/17 1557  NA 138  K 3.9  CL 103  CO2 22  GLUCOSE 96  BUN 10  CREATININE 0.50  CALCIUM 10.0   GFR: CrCl cannot be calculated (Unknown ideal weight.). Liver Function Tests:  Recent Labs Lab 05/07/17 1557  AST 16  ALT 19  ALKPHOS 72  BILITOT 0.8  PROT 8.0  ALBUMIN 4.1    Recent Labs Lab 05/07/17 1557  LIPASE 27   No results for input(s): AMMONIA in the last 168 hours. Coagulation Profile: No results for input(s): INR, PROTIME in the last 168 hours. Cardiac Enzymes: No results for input(s): CKTOTAL, CKMB, CKMBINDEX, TROPONINI in the last 168 hours. BNP (last 3 results) No results for input(s): PROBNP in the last 8760 hours. HbA1C: No results for input(s): HGBA1C in the last 72 hours. CBG: No results for input(s): GLUCAP in the last 168 hours. Lipid Profile: No results for input(s): CHOL, HDL, LDLCALC, TRIG, CHOLHDL, LDLDIRECT in the last 72 hours. Thyroid Function Tests: No results for input(s): TSH, T4TOTAL, FREET4, T3FREE, THYROIDAB in the last 72 hours. Anemia Panel: No results for input(s): VITAMINB12, FOLATE, FERRITIN, TIBC, IRON, RETICCTPCT in the last 72 hours. Urine analysis:    Component Value Date/Time   COLORURINE YELLOW 05/07/2017 1555   APPEARANCEUR HAZY (A) 05/07/2017 1555   LABSPEC 1.017 05/07/2017 1555   PHURINE 5.0 05/07/2017 1555   GLUCOSEU NEGATIVE 05/07/2017 1555   HGBUR NEGATIVE 05/07/2017 1555   BILIRUBINUR NEGATIVE 05/07/2017 1555   KETONESUR 20 (A) 05/07/2017 1555   PROTEINUR  NEGATIVE 05/07/2017 1555   NITRITE NEGATIVE 05/07/2017 1555   LEUKOCYTESUR NEGATIVE 05/07/2017 1555   Sepsis Labs: @LABRCNTIP (procalcitonin:4,lacticidven:4) )No results found for this or any previous visit (from the past 240 hour(s)).   Radiological Exams on Admission: Ct Abdomen Pelvis W Contrast  Result Date: 05/07/2017 CLINICAL DATA:  Acute onset of generalized abdominal pain and distention. Nausea. Initial encounter. EXAM: CT ABDOMEN AND PELVIS WITH CONTRAST TECHNIQUE: Multidetector CT imaging of the abdomen and pelvis was performed using the standard protocol following bolus administration of intravenous contrast. CONTRAST:  100 mL ISOVUE-300 IOPAMIDOL (ISOVUE-300) INJECTION 61% COMPARISON:  None. FINDINGS: Lower chest: The visualized lung bases are grossly clear. The visualized portions of the mediastinum are unremarkable. Hepatobiliary: The liver is unremarkable in appearance. The patient is status post cholecystectomy, with clips noted at the gallbladder fossa. The common bile duct remains normal in caliber.  Pancreas: The pancreas is grossly unremarkable in appearance. However, a vague hypoattenuating masslike density is noted anterior to the distal pancreatic body, described in further detail below. Spleen: The spleen is unremarkable in appearance. Adrenals/Urinary Tract: The adrenal glands are unremarkable in appearance. The kidneys are within normal limits. There is no evidence of hydronephrosis. No renal or ureteral stones are identified. No perinephric stranding is seen. Stomach/Bowel: There is a heterogeneous hypoattenuating 7.6 x 7.1 x 4.4 cm masslike density posterior to the body of the stomach, with poor distinction of the gastric wall. Mild surrounding soft tissue inflammation is seen, with mildly prominent nodes measuring up to 1.0 cm in short axis. It abuts the pancreas, but appears to be separate from the pancreas. This is most concerning for a gastric malignancy, such as a GI stromal  tumor, though a severe gastric ulceration might have a similar appearance. The remainder of the stomach is grossly unremarkable in appearance. The small bowel is grossly unremarkable. The appendix is normal in caliber, without evidence for appendicitis. Scattered diverticulosis is noted along the ascending, descending and sigmoid colon, without evidence of diverticulitis. Vascular/Lymphatic: Minimal calcification is seen along the abdominal aorta and its branches. No retroperitoneal or pelvic sidewall lymphadenopathy is seen. Reproductive: The bladder is mildly distended and grossly unremarkable. The uterus is unremarkable in appearance. An intrauterine device is noted in expected position at the fundus of the uterus. The ovaries are relatively symmetric, aside from a 2.6 cm left-sided follicle. Other: No additional soft tissue abnormalities are seen. Musculoskeletal: No acute osseous abnormalities are identified. The visualized musculature is unremarkable in appearance. IMPRESSION: 1. Heterogeneous hypoattenuating 7.6 x 7.1 x 4.4 cm masslike density posterior to the body of the stomach, with poor distinction of the gastric wall. Mild surrounding soft tissue inflammation, with mildly prominent nodes measuring up to 1.0 cm in short axis. This is most concerning for gastric malignancy, such as a GI stromal tumor, though a severe gastric ulceration might have a similar appearance. Endoscopy is recommended for further evaluation. 2. Scattered diverticulosis along the ascending, descending and sigmoid colon, without evidence of diverticulitis. Electronically Signed   By: Roanna Raider M.D.   On: 05/07/2017 18:34     Assessment/Plan Active Problems:   Abdominal pain   Hypertriglyceridemia   NICM (nonischemic cardiomyopathy) (HCC)   History of pancreatitis    1. Abdominal pain with gastric mass - patient is kept nothing by mouth except medication in anticipation of EGD. Pain only medications IV fluids.  Gastroenterologist Dr. Fritzi Mandes was consulted by the ER physician. Since there is a possibility of peptic ulcer disease patient is placed on Protonix. 2. History of nonischemic cardiomyopathy - appears compensated. On Coreg which will be continued. 3. History of chronic recurrent pancreatitis secondary to hypertriglyceridemia - abdominal pain at this time appears to be from gastric mass. 4. Normocytic normochromic anemia - follow CBC. 5. Hypertension - will keep patient on when necessary IV hydralazine for now. Patient is also on oral Coreg. 6. Patient drinks alcohol at least 4 times a week for which I have placed patient on CIWA protocol.   DVT prophylaxis: SCDs. Code Status: Full code.  Family Communication: Patient's mother.  Disposition Plan: Home.  Consults called: Gastroenterologist.  Admission status: Observation.    Eduard Clos MD [facility] Pager [phone].  If 7PM-7AM, please contact night-coverage [email or web address] Password [email or web address]  05/07/2017, 10:23 PM

## 2017-05-07 NOTE — ED Triage Notes (Signed)
Pt states she has had abdominal pain with distension since Sunday.  Getting worse.  Hurts more with movement.  LBM today more loose than normal..  Nausea with no vomiting.  No fever.  No change in urination.

## 2017-05-07 NOTE — Telephone Encounter (Signed)
Spoke to patient. Patient is headed to [facility] ED. Plans to still see Sam tomorrow for establish appointment.

## 2017-05-07 NOTE — Telephone Encounter (Signed)
[facility] - Client TELEPHONE ADVICE RECORD [facility] Patient Name: Joyce Cline DOB: 1971-03-06 Initial Comment Caller is having bad pain in her left side when she moves or takes a deep breath. Is in her left side near her intestines. Been taking Advil but it hasn't been helping much. Nurse Assessment Nurse: Lane Hacker, RN, Elvin So Date/Time (Eastern Time): 05/07/2017 1:33:59 PM Confirm and document reason for call. If symptomatic, describe symptoms. ---Caller is having severe pain in her left mid side of abdomen when she moves or takes a deep breath. Rates pain now 5/10 after 3 otc Ibuprofen. With movement, increases to 6 1/2 /10, and w/o meds this AM it was 8/10. Started Sunday. She is Vegan, and last week was on vacation and was more Vegetarian. Does the patient have any new or worsening symptoms? ---Yes Will a triage be completed? ---Yes Related visit to physician within the last 2 weeks? ---No Does the PT have any chronic conditions? (i.e. diabetes, asthma, etc.) ---Yes List chronic conditions. ---chronic pancreatitis, high triglycerides, non ischemic cardiomyopathy - had a virus, Is the patient pregnant or possibly pregnant? (Ask all females between the ages of 42-55) ---No Is this a behavioral health or substance abuse call? ---No Guidelines Guideline Title Affirmed Question Affirmed Notes Abdominal Pain - Female [1] MILD-MODERATE pain AND [2] constant AND [3] present > 2 hours Final Disposition User See Physician within 4 Hours (or PCP triage) Lane Hacker, RN, Elvin So Comments Appt made for today at 3 pm with Dr. Bufford Buttner Referrals REFERRED TO PCP OFFICE Disagree/Comply: Comply

## 2017-05-07 NOTE — ED Notes (Signed)
Patient resting in a position of stated comfort. Family at bedside. No needs voiced

## 2017-05-08 ENCOUNTER — Encounter (HOSPITAL_COMMUNITY): Payer: Self-pay

## 2017-05-08 ENCOUNTER — Encounter (HOSPITAL_COMMUNITY)
Admission: EM | Disposition: A | Discharge status: Home / Self Care | Payer: Self-pay | Source: Home / Self Care | Attending: Internal Medicine

## 2017-05-08 ENCOUNTER — Ambulatory Visit: Payer: Self-pay | Admitting: Physician Assistant

## 2017-05-08 DIAGNOSIS — R935 Abnormal findings on diagnostic imaging of other abdominal regions, including retroperitoneum: Secondary | ICD-10-CM | POA: Diagnosis not present

## 2017-05-08 DIAGNOSIS — K219 Gastro-esophageal reflux disease without esophagitis: Secondary | ICD-10-CM | POA: Diagnosis present

## 2017-05-08 DIAGNOSIS — R1013 Epigastric pain: Secondary | ICD-10-CM

## 2017-05-08 DIAGNOSIS — M6282 Rhabdomyolysis: Secondary | ICD-10-CM | POA: Diagnosis present

## 2017-05-08 DIAGNOSIS — K3189 Other diseases of stomach and duodenum: Secondary | ICD-10-CM

## 2017-05-08 DIAGNOSIS — E876 Hypokalemia: Secondary | ICD-10-CM | POA: Diagnosis present

## 2017-05-08 DIAGNOSIS — K319 Disease of stomach and duodenum, unspecified: Secondary | ICD-10-CM | POA: Diagnosis not present

## 2017-05-08 DIAGNOSIS — D638 Anemia in other chronic diseases classified elsewhere: Secondary | ICD-10-CM | POA: Diagnosis present

## 2017-05-08 DIAGNOSIS — E781 Pure hyperglyceridemia: Secondary | ICD-10-CM | POA: Diagnosis present

## 2017-05-08 DIAGNOSIS — K858 Other acute pancreatitis without necrosis or infection: Secondary | ICD-10-CM | POA: Diagnosis present

## 2017-05-08 DIAGNOSIS — R1012 Left upper quadrant pain: Secondary | ICD-10-CM | POA: Diagnosis present

## 2017-05-08 DIAGNOSIS — R0602 Shortness of breath: Secondary | ICD-10-CM | POA: Diagnosis not present

## 2017-05-08 DIAGNOSIS — K859 Acute pancreatitis without necrosis or infection, unspecified: Secondary | ICD-10-CM | POA: Diagnosis not present

## 2017-05-08 DIAGNOSIS — I428 Other cardiomyopathies: Secondary | ICD-10-CM | POA: Diagnosis present

## 2017-05-08 DIAGNOSIS — I509 Heart failure, unspecified: Secondary | ICD-10-CM | POA: Diagnosis present

## 2017-05-08 DIAGNOSIS — R1909 Other intra-abdominal and pelvic swelling, mass and lump: Secondary | ICD-10-CM | POA: Diagnosis present

## 2017-05-08 DIAGNOSIS — Z8719 Personal history of other diseases of the digestive system: Secondary | ICD-10-CM | POA: Diagnosis not present

## 2017-05-08 DIAGNOSIS — K861 Other chronic pancreatitis: Secondary | ICD-10-CM | POA: Diagnosis present

## 2017-05-08 DIAGNOSIS — D509 Iron deficiency anemia, unspecified: Secondary | ICD-10-CM | POA: Diagnosis present

## 2017-05-08 DIAGNOSIS — F17200 Nicotine dependence, unspecified, uncomplicated: Secondary | ICD-10-CM | POA: Diagnosis present

## 2017-05-08 DIAGNOSIS — I110 Hypertensive heart disease with heart failure: Secondary | ICD-10-CM | POA: Diagnosis present

## 2017-05-08 DIAGNOSIS — Z79899 Other long term (current) drug therapy: Secondary | ICD-10-CM | POA: Diagnosis not present

## 2017-05-08 DIAGNOSIS — K2970 Gastritis, unspecified, without bleeding: Secondary | ICD-10-CM | POA: Diagnosis present

## 2017-05-08 HISTORY — PX: ESOPHAGOGASTRODUODENOSCOPY: SHX5428

## 2017-05-08 LAB — BASIC METABOLIC PANEL
ANION GAP: 8 (ref 5–15)
BUN: 5 mg/dL — ABNORMAL LOW (ref 6–20)
CALCIUM: 8.7 mg/dL — AB (ref 8.9–10.3)
CO2: 23 mmol/L (ref 22–32)
CREATININE: 0.47 mg/dL (ref 0.44–1.00)
Chloride: 104 mmol/L (ref 101–111)
Glucose, Bld: 123 mg/dL — ABNORMAL HIGH (ref 65–99)
Potassium: 3.6 mmol/L (ref 3.5–5.1)
Sodium: 135 mmol/L (ref 135–145)

## 2017-05-08 LAB — GLUCOSE, CAPILLARY
GLUCOSE-CAPILLARY: 89 mg/dL (ref 65–99)
GLUCOSE-CAPILLARY: 125 mg/dL — AB (ref 65–99)
Glucose-Capillary: 131 mg/dL — ABNORMAL HIGH (ref 65–99)
Glucose-Capillary: 95 mg/dL (ref 65–99)

## 2017-05-08 LAB — CBC
HEMATOCRIT: 31.7 % — AB (ref 36.0–46.0)
HEMOGLOBIN: 10.4 g/dL — AB (ref 12.0–15.0)
MCH: 32.3 pg (ref 26.0–34.0)
MCHC: 32.8 g/dL (ref 30.0–36.0)
MCV: 98.4 fL (ref 78.0–100.0)
Platelets: 280 10*3/uL (ref 150–400)
RBC: 3.22 MIL/uL — AB (ref 3.87–5.11)
RDW: 12.8 % (ref 11.5–15.5)
WBC: 7.2 10*3/uL (ref 4.0–10.5)

## 2017-05-08 LAB — HEPATIC FUNCTION PANEL
ALBUMIN: 3.5 g/dL (ref 3.5–5.0)
ALT: 16 U/L (ref 14–54)
AST: 17 U/L (ref 15–41)
Alkaline Phosphatase: 64 U/L (ref 38–126)
BILIRUBIN INDIRECT: 0.5 mg/dL (ref 0.3–0.9)
Bilirubin, Direct: 0.1 mg/dL (ref 0.1–0.5)
TOTAL PROTEIN: 6.9 g/dL (ref 6.5–8.1)
Total Bilirubin: 0.6 mg/dL (ref 0.3–1.2)

## 2017-05-08 LAB — IRON AND TIBC
Iron: 43 ug/dL (ref 28–170)
SATURATION RATIOS: 13 % (ref 10.4–31.8)
TIBC: 337 ug/dL (ref 250–450)
UIBC: 294 ug/dL

## 2017-05-08 LAB — MAGNESIUM: MAGNESIUM: 2 mg/dL (ref 1.7–2.4)

## 2017-05-08 LAB — RETICULOCYTES
RBC.: 3.29 MIL/uL — AB (ref 3.87–5.11)
RETIC CT PCT: 2.2 % (ref 0.4–3.1)
Retic Count, Absolute: 72.4 10*3/uL (ref 19.0–186.0)

## 2017-05-08 LAB — FERRITIN: Ferritin: 303 ng/mL (ref 11–307)

## 2017-05-08 LAB — FOLATE: Folate: 35.2 ng/mL (ref >5.9)

## 2017-05-08 LAB — HIV ANTIBODY (ROUTINE TESTING W REFLEX): HIV SCREEN 4TH GENERATION: NONREACTIVE

## 2017-05-08 LAB — VITAMIN B12: VITAMIN B 12: 1181 pg/mL — AB (ref 180–914)

## 2017-05-08 SURGERY — EGD (ESOPHAGOGASTRODUODENOSCOPY)
Anesthesia: Moderate Sedation

## 2017-05-08 MED ORDER — MIDAZOLAM HCL 10 MG/2ML IJ SOLN
INTRAMUSCULAR | Status: DC | PRN
  Administered 2017-05-08 (×2): 2 mg via INTRAVENOUS

## 2017-05-08 MED ORDER — DIPHENHYDRAMINE HCL 50 MG/ML IJ SOLN
INTRAMUSCULAR | Status: DC | PRN
  Administered 2017-05-08: 25 mg via INTRAVENOUS

## 2017-05-08 MED ORDER — PANCRELIPASE (LIP-PROT-AMYL) 12000-38000 UNITS PO CPEP
12000.0000 [IU] | ORAL_CAPSULE | Freq: Three times a day (TID) | ORAL | Status: DC
  Administered 2017-05-08 – 2017-05-14 (×19): 12000 [IU] via ORAL
  Filled 2017-05-08 (×19): qty 1

## 2017-05-08 MED ORDER — FENTANYL CITRATE (PF) 100 MCG/2ML IJ SOLN
INTRAMUSCULAR | Status: AC
  Filled 2017-05-08: qty 2

## 2017-05-08 MED ORDER — TRAMADOL HCL 50 MG PO TABS
50.0000 mg | ORAL_TABLET | Freq: Four times a day (QID) | ORAL | Status: DC | PRN
  Administered 2017-05-09: 50 mg via ORAL
  Filled 2017-05-08: qty 1

## 2017-05-08 MED ORDER — DIPHENHYDRAMINE HCL 50 MG/ML IJ SOLN
INTRAMUSCULAR | Status: AC
  Filled 2017-05-08: qty 1

## 2017-05-08 MED ORDER — FENTANYL CITRATE (PF) 100 MCG/2ML IJ SOLN
INTRAMUSCULAR | Status: DC | PRN
  Administered 2017-05-08 (×2): 25 ug via INTRAVENOUS

## 2017-05-08 MED ORDER — MIDAZOLAM HCL 5 MG/ML IJ SOLN
INTRAMUSCULAR | Status: AC
  Filled 2017-05-08: qty 2

## 2017-05-08 MED ORDER — OMEGA-3-ACID ETHYL ESTERS 1 G PO CAPS
1.0000 g | ORAL_CAPSULE | Freq: Every day | ORAL | Status: DC
  Administered 2017-05-08 – 2017-05-14 (×7): 1 g via ORAL
  Filled 2017-05-08 (×7): qty 1

## 2017-05-08 MED ORDER — BUTAMBEN-TETRACAINE-BENZOCAINE 2-2-14 % EX AERO
INHALATION_SPRAY | CUTANEOUS | Status: DC | PRN
  Administered 2017-05-08: 2 via TOPICAL

## 2017-05-08 NOTE — Consult Note (Signed)
[facility] Consult/Admission Note  Joyce Cline [birth year]  [id number].    Requesting MD: Dr. Irine Seal Chief Complaint/Reason for Consult: Gastric mass   HPI:  Pt is a 46 year old female with a history of pancreatitis 2/2 hypertriglyceridemia previously requiring plasmapheresis, cholecystectomy, CHF and nonischemic cardiomyopathy (pt states has resolved), hx of blood transfusion for a spontaneous bleed posterior to her spleen, HTN who presented to the ED with complaints of LUQ abdominal pain for roughly 11 days. Pt states roughly 11 days ago she had excruciating LUQ abdominal pain that improved over time but then got worse. She was taking ibuprofen with some relief. She states the pain is constant, worse with movement or taking a deep breath, sometimes radiates into her back, and relieved with dilaudid. Associated night sweats since the onset of pain and SOB 2/2 to the pain. No fevers. Pt has an IUD so does not have a normal menstrual cycle. She denies pain with eating, nausea, vomiting, diarrhea, CP, cough.   ROS:  Review of Systems  Constitutional: Positive for diaphoresis. Negative for chills, fever and malaise/fatigue.  HENT: Negative for sore throat.   Respiratory: Positive for shortness of breath. Negative for cough.   Cardiovascular: Negative for chest pain.  Gastrointestinal: Positive for abdominal pain. Negative for blood in stool, constipation, diarrhea, nausea and vomiting.  Genitourinary: Negative for dysuria and hematuria.  Skin: Negative for rash.  Neurological: Negative for dizziness, loss of consciousness and headaches.  All other systems reviewed and are negative.    Family History  Problem Relation Age of Onset  . Uterine cancer Maternal Grandmother     Past Medical History:  Diagnosis Date  . History of blood transfusion   . Hyperlipidemia   . Hypertension   . NICM (nonischemic cardiomyopathy) ([place])   . Pancreatitis    etiology:  hypertryglyceridemia.  treated for this with plasmapheresis.   . Wisdom teeth extracted     Past Surgical History:  Procedure Laterality Date  . CESAREAN SECTION    . CHOLECYSTECTOMY      Social History:  reports that she has been smoking.  She has never used smokeless tobacco. She reports that she drinks alcohol. She reports that she does not use drugs.  Allergies: No Known Allergies  Medications Prior to Admission  Medication Sig Dispense Refill  . amLODipine (NORVASC) 5 MG tablet Take 5 mg by mouth daily.  0  . carvedilol (COREG) 25 MG tablet Take 25 mg by mouth 2 (two) times daily.  0  . Chromium 1000 MCG TABS Take 1,000 mcg by mouth daily.     Marland Kitchen CREON 6000 units CPEP Take 6,000-12,000 Units by mouth 3 (three) times daily with meals.   0  . Cyanocobalamin (VITAMIN B-12) 6000 MCG SUBL Place 6,000 mcg under the tongue daily.    Marland Kitchen ibuprofen (ADVIL,MOTRIN) 200 MG tablet Take 600 mg by mouth every 6 (six) hours as needed for headache, mild pain or moderate pain.    . Magnesium 500 MG TABS Take 500 mg by mouth at bedtime.    . Multiple Vitamin (MULTIVITAMIN WITH MINERALS) TABS tablet Take 1 tablet by mouth daily.    Marland Kitchen omega-3 acid ethyl esters (LOVAZA) 1 g capsule Take 1 g by mouth daily.    . traMADol (ULTRAM) 50 MG tablet Take 50 mg by mouth every 6 (six) hours as needed for moderate pain.     . Turmeric 500 MG CAPS Take 500 mg by mouth 2 (two) times daily.  Blood pressure 130/79, pulse 83, temperature 98.3 F (36.8 C), temperature source Oral, resp. rate 18, height 5' 2"  (1.575 m), weight 165 lb (74.8 kg), last menstrual period 04/25/2017, SpO2 97 %.  Physical Exam  Constitutional: She is oriented to person, place, and time and well-developed, well-nourished, and in no distress. Vital signs are normal. No distress.  Well appearing and pleasant  HENT:  Head: Normocephalic and atraumatic.  Nose: Nose normal.  Mouth/Throat: Oropharynx is clear and moist. No oropharyngeal  exudate.  Eyes: Conjunctivae and EOM are normal. Pupils are equal, round, and reactive to light. Right eye exhibits no discharge. Left eye exhibits no discharge. No scleral icterus.  Neck: Normal range of motion. Neck supple. No tracheal deviation present. No thyromegaly present.  Cardiovascular: Normal rate, regular rhythm, normal heart sounds and intact distal pulses.  Exam reveals no gallop and no friction rub.   No murmur heard. Pulses:      Radial pulses are 2+ on the right side, and 2+ on the left side.       Dorsalis pedis pulses are 2+ on the right side, and 2+ on the left side.  Pulmonary/Chest: Effort normal and breath sounds normal. No respiratory distress. She has no decreased breath sounds. She has no wheezes. She has no rhonchi. She has no rales.  Abdominal: Soft. Normal appearance and bowel sounds are normal. She exhibits no distension and no mass. There is no hepatosplenomegaly. There is tenderness in the epigastric area and left upper quadrant. There is no rebound and no guarding.  Musculoskeletal: Normal range of motion. She exhibits no edema or deformity.  Lymphadenopathy:       Head (right side): No submandibular and no posterior auricular adenopathy present.       Head (left side): No submandibular and no posterior auricular adenopathy present.    She has no cervical adenopathy.    She has no axillary adenopathy.       Right: No supraclavicular adenopathy present.       Left: No supraclavicular adenopathy present.  Neurological: She is alert and oriented to person, place, and time. No cranial nerve deficit (grossly normal).  Skin: Skin is warm and dry. No rash noted. She is not diaphoretic.  Psychiatric: Mood and affect normal.  Nursing note and vitals reviewed.   Results for orders placed or performed during the hospital encounter of 05/07/17 (from the past 48 hour(s))  Urinalysis, Routine w reflex microscopic     Status: Abnormal   Collection Time: 05/07/17  3:55 PM   Result Value Ref Range   Color, Urine YELLOW YELLOW   APPearance HAZY (A) CLEAR   Specific Gravity, Urine 1.017 1.005 - 1.030   pH 5.0 5.0 - 8.0   Glucose, UA NEGATIVE NEGATIVE mg/dL   Hgb urine dipstick NEGATIVE NEGATIVE   Bilirubin Urine NEGATIVE NEGATIVE   Ketones, ur 20 (A) NEGATIVE mg/dL   Protein, ur NEGATIVE NEGATIVE mg/dL   Nitrite NEGATIVE NEGATIVE   Leukocytes, UA NEGATIVE NEGATIVE  Lipase, blood     Status: None   Collection Time: 05/07/17  3:57 PM  Result Value Ref Range   Lipase 27 11 - 51 U/L  Comprehensive metabolic panel     Status: None   Collection Time: 05/07/17  3:57 PM  Result Value Ref Range   Sodium 138 135 - 145 mmol/L   Potassium 3.9 3.5 - 5.1 mmol/L   Chloride 103 101 - 111 mmol/L   CO2 22 22 - 32 mmol/L  Glucose, Bld 96 65 - 99 mg/dL   BUN 10 6 - 20 mg/dL   Creatinine, Ser 0.50 0.44 - 1.00 mg/dL   Calcium 10.0 8.9 - 10.3 mg/dL   Total Protein 8.0 6.5 - 8.1 g/dL   Albumin 4.1 3.5 - 5.0 g/dL   AST 16 15 - 41 U/L   ALT 19 14 - 54 U/L   Alkaline Phosphatase 72 38 - 126 U/L   Total Bilirubin 0.8 0.3 - 1.2 mg/dL   GFR calc non Af Amer >60 >60 mL/min   GFR calc Af Amer >60 >60 mL/min    Comment: (NOTE) The eGFR has been calculated using the CKD EPI equation. This calculation has not been validated in all clinical situations. eGFR's persistently <60 mL/min signify possible Chronic Kidney Disease.    Anion gap 13 5 - 15  CBC     Status: Abnormal   Collection Time: 05/07/17  3:57 PM  Result Value Ref Range   WBC 9.8 4.0 - 10.5 K/uL   RBC 3.39 (L) 3.87 - 5.11 MIL/uL   Hemoglobin 11.2 (L) 12.0 - 15.0 g/dL   HCT 33.0 (L) 36.0 - 46.0 %   MCV 97.3 78.0 - 100.0 fL   MCH 33.0 26.0 - 34.0 pg   MCHC 33.9 30.0 - 36.0 g/dL   RDW 12.9 11.5 - 15.5 %   Platelets 316 150 - 400 K/uL  I-Stat beta hCG blood, ED     Status: None   Collection Time: 05/07/17  4:08 PM  Result Value Ref Range   I-stat hCG, quantitative <5.0 <5 mIU/mL   Comment 3             Comment:   GEST. AGE      CONC.  (mIU/mL)   <=1 WEEK        5 - 50     2 WEEKS       50 - 500     3 WEEKS       100 - 10,000     4 WEEKS     1,000 - 30,000        FEMALE AND NON-PREGNANT FEMALE:     LESS THAN 5 mIU/mL   Glucose, capillary     Status: Abnormal   Collection Time: 05/08/17 12:16 AM  Result Value Ref Range   Glucose-Capillary 131 (H) 65 - 99 mg/dL   Comment 1 Notify RN    Comment 2 Call MD NNP PA CNM   Hepatic function panel     Status: None   Collection Time: 05/08/17  3:55 AM  Result Value Ref Range   Total Protein 6.9 6.5 - 8.1 g/dL   Albumin 3.5 3.5 - 5.0 g/dL   AST 17 15 - 41 U/L   ALT 16 14 - 54 U/L   Alkaline Phosphatase 64 38 - 126 U/L   Total Bilirubin 0.6 0.3 - 1.2 mg/dL   Bilirubin, Direct 0.1 0.1 - 0.5 mg/dL   Indirect Bilirubin 0.5 0.3 - 0.9 mg/dL  Magnesium     Status: None   Collection Time: 05/08/17  3:55 AM  Result Value Ref Range   Magnesium 2.0 1.7 - 2.4 mg/dL  HIV antibody (Routine Testing)     Status: None   Collection Time: 05/08/17  3:55 AM  Result Value Ref Range   HIV Screen 4th Generation wRfx Non Reactive Non Reactive    Comment: (NOTE) Performed At: [facility] [address] [place], [place] [id number] Evette Doffing  Darrick Penna MD KG:[id number]   Basic metabolic panel     Status: Abnormal   Collection Time: 05/08/17  3:55 AM  Result Value Ref Range   Sodium 135 135 - 145 mmol/L   Potassium 3.6 3.5 - 5.1 mmol/L   Chloride 104 101 - 111 mmol/L   CO2 23 22 - 32 mmol/L   Glucose, Bld 123 (H) 65 - 99 mg/dL   BUN 5 (L) 6 - 20 mg/dL   Creatinine, Ser 0.47 0.44 - 1.00 mg/dL   Calcium 8.7 (L) 8.9 - 10.3 mg/dL   GFR calc non Af Amer >60 >60 mL/min   GFR calc Af Amer >60 >60 mL/min    Comment: (NOTE) The eGFR has been calculated using the CKD EPI equation. This calculation has not been validated in all clinical situations. eGFR's persistently <60 mL/min signify possible Chronic Kidney Disease.    Anion gap 8 5 - 15  CBC      Status: Abnormal   Collection Time: 05/08/17  3:55 AM  Result Value Ref Range   WBC 7.2 4.0 - 10.5 K/uL   RBC 3.22 (L) 3.87 - 5.11 MIL/uL   Hemoglobin 10.4 (L) 12.0 - 15.0 g/dL   HCT 31.7 (L) 36.0 - 46.0 %   MCV 98.4 78.0 - 100.0 fL   MCH 32.3 26.0 - 34.0 pg   MCHC 32.8 30.0 - 36.0 g/dL   RDW 12.8 11.5 - 15.5 %   Platelets 280 150 - 400 K/uL  Glucose, capillary     Status: None   Collection Time: 05/08/17  6:19 AM  Result Value Ref Range   Glucose-Capillary 95 65 - 99 mg/dL   Comment 1 Notify RN   Vitamin B12     Status: Abnormal   Collection Time: 05/08/17  6:55 AM  Result Value Ref Range   Vitamin B-12 1,181 (H) 180 - 914 pg/mL    Comment: (NOTE) This assay is not validated for testing neonatal or myeloproliferative syndrome specimens for Vitamin B12 levels. Performed at [facility], [place] [address] [place], [place] [address]   Folate     Status: None   Collection Time: 05/08/17  6:55 AM  Result Value Ref Range   Folate 35.2 >5.9 ng/mL    Comment: Performed at [facility] [address] [place], [place] [address]  Iron and TIBC     Status: None   Collection Time: 05/08/17  6:55 AM  Result Value Ref Range   Iron 43 28 - 170 ug/dL   TIBC 337 250 - 450 ug/dL   Saturation Ratios 13 10.4 - 31.8 %   UIBC 294 ug/dL    Comment: Performed at [facility], [place] [address] [place], [place] [address]  Ferritin     Status: None   Collection Time: 05/08/17  6:55 AM  Result Value Ref Range   Ferritin 303 11 - 307 ng/mL    Comment: Performed at [facility], [place] [address], [place], [place] [address]  Reticulocytes     Status: Abnormal   Collection Time: 05/08/17  6:55 AM  Result Value Ref Range   Retic Ct Pct 2.2 0.4 - 3.1 %   RBC. 3.29 (L) 3.87 - 5.11 MIL/uL   Retic Count, Manual 72.4 19.0 - 186.0 K/uL  Glucose, capillary     Status: None   Collection Time: 05/08/17 12:32 PM  Result Value Ref Range   Glucose-Capillary 89 65 - 99 mg/dL  Comment 1  Notify RN    Comment 2 Document in Chart    Ct Abdomen Pelvis W Contrast  Result Date: 05/07/2017 CLINICAL DATA:  Acute onset of generalized abdominal pain and distention. Nausea. Initial encounter. EXAM: CT ABDOMEN AND PELVIS WITH CONTRAST TECHNIQUE: Multidetector CT imaging of the abdomen and pelvis was performed using the standard protocol following bolus administration of intravenous contrast. CONTRAST:  100 mL ISOVUE-300 IOPAMIDOL (ISOVUE-300) INJECTION 61% COMPARISON:  None. FINDINGS: Lower chest: The visualized lung bases are grossly clear. The visualized portions of the mediastinum are unremarkable. Hepatobiliary: The liver is unremarkable in appearance. The patient is status post cholecystectomy, with clips noted at the gallbladder fossa. The common bile duct remains normal in caliber. Pancreas: The pancreas is grossly unremarkable in appearance. However, a vague hypoattenuating masslike density is noted anterior to the distal pancreatic body, described in further detail below. Spleen: The spleen is unremarkable in appearance. Adrenals/Urinary Tract: The adrenal glands are unremarkable in appearance. The kidneys are within normal limits. There is no evidence of hydronephrosis. No renal or ureteral stones are identified. No perinephric stranding is seen. Stomach/Bowel: There is a heterogeneous hypoattenuating 7.6 x 7.1 x 4.4 cm masslike density posterior to the body of the stomach, with poor distinction of the gastric wall. Mild surrounding soft tissue inflammation is seen, with mildly prominent nodes measuring up to 1.0 cm in short axis. It abuts the pancreas, but appears to be separate from the pancreas. This is most concerning for a gastric malignancy, such as a GI stromal tumor, though a severe gastric ulceration might have a similar appearance. The remainder of the stomach is grossly unremarkable in appearance. The small bowel is grossly unremarkable. The appendix is normal in caliber, without  evidence for appendicitis. Scattered diverticulosis is noted along the ascending, descending and sigmoid colon, without evidence of diverticulitis. Vascular/Lymphatic: Minimal calcification is seen along the abdominal aorta and its branches. No retroperitoneal or pelvic sidewall lymphadenopathy is seen. Reproductive: The bladder is mildly distended and grossly unremarkable. The uterus is unremarkable in appearance. An intrauterine device is noted in expected position at the fundus of the uterus. The ovaries are relatively symmetric, aside from a 2.6 cm left-sided follicle. Other: No additional soft tissue abnormalities are seen. Musculoskeletal: No acute osseous abnormalities are identified. The visualized musculature is unremarkable in appearance. IMPRESSION: 1. Heterogeneous hypoattenuating 7.6 x 7.1 x 4.4 cm masslike density posterior to the body of the stomach, with poor distinction of the gastric wall. Mild surrounding soft tissue inflammation, with mildly prominent nodes measuring up to 1.0 cm in short axis. This is most concerning for gastric malignancy, such as a GI stromal tumor, though a severe gastric ulceration might have a similar appearance. Endoscopy is recommended for further evaluation. 2. Scattered diverticulosis along the ascending, descending and sigmoid colon, without evidence of diverticulitis. Electronically Signed   By: Garald Balding M.D.   On: 05/07/2017 18:34   Dg Chest Port 1 View  Result Date: 05/07/2017 CLINICAL DATA:  Shortness of breath. EXAM: PORTABLE CHEST 1 VIEW COMPARISON:  None. FINDINGS: The cardiomediastinal contours are normal. The lungs are clear. Pulmonary vasculature is normal. No consolidation, pleural effusion, or pneumothorax. No acute osseous abnormalities are seen. IMPRESSION: No acute pulmonary process. Electronically Signed   By: Jeb Levering M.D.   On: 05/07/2017 22:46      Assessment/Plan  Gastric mass - unsure of what this may is but could be  lymphoma with history of night sweats, gastric cancer, or an  inflammatory process - we do not feel surgical exploration is the best initial step in the management of this mass. If pain improves then we recommend a CT scan in 1- 2 weeks to see if mass is resolving. Recommend biopsy as the next step if no improvement or worsening of symptoms.   We will continue to follow this patient. Thank you for this very interesting consult.   Kalman Drape, [facility] 05/08/2017, 3:12 PM Pager: [phone] Consults: [phone] Mon-Fri 7:00 am-4:30 pm Sat-Sun 7:00 am-11:30 am

## 2017-05-08 NOTE — Consult Note (Signed)
Referring Provider: [facility] Primary Care Physician:  Patient, No Pcp Per Primary Gastroenterologist:  Unassigned  Reason for Consultation:  LUQ pain / Abnormal CTscan.   ASSESSMENT AND PLAN:   1. 46 yo female with acute LUQ pain /  mass posterior to body of stomach with poor distinction of gastric body wall. Abuts but appears to be separate from the pancreas. 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  -Will probably need EGD for further evaluation of CTscan findings. She is NPO.    2. Chronic pancreatitis secondary to hypertriglyceridemia. Except for mass like structure anterior to pancreas on CTscan, pancreas grossly unremarkable. On Creon.  3. Mild normocytic anemia. Hgb 10.4. Anemia panel doesn't suggest iron deficiency.   3. GERD, manages with anti-reflux measures  4. Non-ischemic cardiomyopathy  HPI: Joyce Cline is a 46 y.o. female with a hx of non-ischemic cardiomyopathy and and chronic pancreatitis secondary to hypertriglyceridemia requiring plasmapheresis in 2016. Marland Kitchen She relocated here from [place] in October 2017. She presented to ED with LUQ pain unlike pain from pancreatitis. Pain started Sunday. It is constant but worse with eating as well as moving around. Some nausea but no vomiting. Her weight is up a few pounds. No fevers. BMs frequently loose but this is chronic. She had an EGD in 2016 (unclear reasons) and states it was normal. She was treated for H.pylori years ago. 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    Past Medical History:  Diagnosis Date  . History of blood transfusion   . Hyperlipidemia   . Hypertension   . NICM (nonischemic cardiomyopathy) (HCC)   . Pancreatitis    etiology: hypertryglyceridemia.  treated for this with plasmapheresis.   . Wisdom teeth extracted     Past Surgical History:  Procedure Laterality Date  . CESAREAN SECTION    . CHOLECYSTECTOMY      Prior to Admission medications   Medication Sig  Start Date End Date Taking? Authorizing Provider  amLODipine (NORVASC) 5 MG tablet Take 5 mg by mouth daily.   Yes [provider]  carvedilol (COREG) 25 MG tablet Take 25 mg by mouth 2 (two) times daily.   Yes [provider]  Chromium 1000 MCG TABS Take 1,000 mcg by mouth daily.    Yes [provider]  CREON 6000 units CPEP Take 6,000-12,000 Units by mouth 3 (three) times daily with meals.    Yes [provider]  Cyanocobalamin (VITAMIN B-12) 6000 MCG SUBL Place 6,000 mcg under the tongue daily.   Yes [provider]  ibuprofen (ADVIL,MOTRIN) 200 MG tablet Take 600 mg by mouth every 6 (six) hours as needed for headache, mild pain or moderate pain.   Yes [provider]  Magnesium 500 MG TABS Take 500 mg by mouth at bedtime.   Yes [provider]  Multiple Vitamin (MULTIVITAMIN WITH MINERALS) TABS tablet Take 1 tablet by mouth daily.   Yes [provider]  omega-3 acid ethyl esters (LOVAZA) 1 g capsule Take 1 g by mouth daily.   Yes [provider]  traMADol (ULTRAM) 50 MG tablet Take 50 mg by mouth every 6 (six) hours as needed for moderate pain.    Yes [provider]  Turmeric 500 MG CAPS Take 500 mg by mouth 2 (two) times daily.   Yes [provider]    Current Facility-Administered Medications  Medication Dose Route Frequency Provider Last Rate Last Dose  . 0.9 %  sodium chloride infusion  Intravenous Continuous Eduard Clos, MD 100 mL/hr at 05/08/17 0010    . acetaminophen (TYLENOL) tablet 650 mg  650 mg Oral Q6H PRN Eduard Clos, MD       Or  . acetaminophen (TYLENOL) suppository 650 mg  650 mg Rectal Q6H PRN Eduard Clos, MD      . carvedilol (COREG) tablet 25 mg  25 mg Oral BID Eduard Clos, MD   25 mg at 05/08/17 0021  . folic acid (FOLVITE) tablet 1 mg  1 mg Oral Daily Eduard Clos, MD      . hydrALAZINE (APRESOLINE) injection 10 mg  10 mg  Intravenous Q4H PRN Eduard Clos, MD      . HYDROmorphone (DILAUDID) injection 0.5 mg  0.5 mg Intravenous Q4H PRN Eduard Clos, MD   0.5 mg at 05/08/17 0818  . LORazepam (ATIVAN) injection 0-4 mg  0-4 mg Intravenous Q6H Eduard Clos, MD   2 mg at 05/08/17 0013   Followed by  . [START ON 05/10/2017] LORazepam (ATIVAN) injection 0-4 mg  0-4 mg Intravenous Q12H Eduard Clos, MD      . LORazepam (ATIVAN) tablet 1 mg  1 mg Oral Q6H PRN Eduard Clos, MD       Or  . LORazepam (ATIVAN) injection 1 mg  1 mg Intravenous Q6H PRN Eduard Clos, MD      . magnesium oxide (MAG-OX) tablet 400 mg  400 mg Oral QHS Eduard Clos, MD   400 mg at 05/08/17 0014  . multivitamin with minerals tablet 1 tablet  1 tablet Oral Daily Eduard Clos, MD      . ondansetron [facility]) tablet 4 mg  4 mg Oral Q6H PRN Eduard Clos, MD       Or  . ondansetron [facility]) injection 4 mg  4 mg Intravenous Q6H PRN Eduard Clos, MD      . pantoprazole (PROTONIX) injection 40 mg  40 mg Intravenous Q12H Eduard Clos, MD   40 mg at 05/08/17 0014  . thiamine (VITAMIN B-1) tablet 100 mg  100 mg Oral Daily Eduard Clos, MD       Or  . thiamine (B-1) injection 100 mg  100 mg Intravenous Daily Eduard Clos, MD        Allergies as of 05/07/2017  . (No Known Allergies)    Family History  Problem Relation Age of Onset  . Uterine cancer Maternal Grandmother     Social History   Social History  . Marital status: Divorced    Spouse name: N/A  . Number of children: N/A  . Years of education: N/A   Occupational History  . Not on file.   Social History Main Topics  . Smoking status: Current Every Day Smoker  . Smokeless tobacco: Never Used  . Alcohol use Yes     Comment: ? daily  . Drug use: No  . Sexual activity: Not on file   Other Topics Concern  . Not on file   Social History Narrative  . No narrative on file    Review of  Systems: All systems reviewed and negative except where noted in HPI.  Physical Exam: Vital signs in last 24 hours: Temp:  [97.9 F (36.6 C)-98.4 F (36.9 C)] 98.4 F (36.9 C) (05/11 0624) Pulse Rate:  [83-96] 83 (05/11 0624) Resp:  [18-20] 18 (05/11 0624) BP: (129-145)/(78-89) 129/89 (05/11 0624) SpO2:  [96 %-99 %]  98 % (05/11 0624) Weight:  [165 lb (74.8 kg)] 165 lb (74.8 kg) (05/10 2250)   General:  Very pleasant, well-developed, female in NAD .Eyes:  Sclera clear, no icterus.   Conjunctiva pink. Ears:  Normal auditory acuity. Nose:  No deformity, discharge,  or lesions.  Neck:  Supple; no masses  Lungs:  Clear throughout to auscultation.   No wheezes, crackles, or rhonchi.  Heart:  Regular rate and rhythm; no murmurs, no edema Abdomen:  Soft,non-distended, mod LUQ tenderness. BS active.   Rectal:  Deferred  Msk:  Symmetrical without gross deformities. . Extremities:  Without clubbing or edema. Neurologic:  Alert and  oriented x4;  grossly normal neurologically. Skin:  Intact without significant lesions or rashes.. Psych:  Alert and cooperative. Normal mood and affect.  Intake/Output from previous day: 05/10 0701 - 05/11 0700 In: 283.3 [I.V.:283.3] Out: -  Intake/Output this shift: No intake/output data recorded.  Lab Results:  Recent Labs  05/07/17 1557 05/08/17 0355  WBC 9.8 7.2  HGB 11.2* 10.4*  HCT 33.0* 31.7*  PLT 316 280   BMET  Recent Labs  05/07/17 1557 05/08/17 0355  NA 138 135  K 3.9 3.6  CL 103 104  CO2 22 23  GLUCOSE 96 123*  BUN 10 5*  CREATININE 0.50 0.47  CALCIUM 10.0 8.7*   LFT  Recent Labs  05/08/17 0355  PROT 6.9  ALBUMIN 3.5  AST 17  ALT 16  ALKPHOS 64  BILITOT 0.6  BILIDIR 0.1  IBILI 0.5    Studies/Results: Ct Abdomen Pelvis W Contrast  Result Date: 05/07/2017 CLINICAL DATA:  Acute onset of generalized abdominal pain and distention. Nausea. Initial encounter. EXAM: CT ABDOMEN AND PELVIS WITH CONTRAST TECHNIQUE:  Multidetector CT imaging of the abdomen and pelvis was performed using the standard protocol following bolus administration of intravenous contrast. CONTRAST:  100 mL ISOVUE-300 IOPAMIDOL (ISOVUE-300) INJECTION 61% COMPARISON:  None. FINDINGS: Lower chest: The visualized lung bases are grossly clear. The visualized portions of the mediastinum are unremarkable. Hepatobiliary: The liver is unremarkable in appearance. The patient is status post cholecystectomy, with clips noted at the gallbladder fossa. The common bile duct remains normal in caliber. Pancreas: The pancreas is grossly unremarkable in appearance. However, a vague hypoattenuating masslike density is noted anterior to the distal pancreatic body, described in further detail below. Spleen: The spleen is unremarkable in appearance. Adrenals/Urinary Tract: The adrenal glands are unremarkable in appearance. The kidneys are within normal limits. There is no evidence of hydronephrosis. No renal or ureteral stones are identified. No perinephric stranding is seen. Stomach/Bowel: There is a heterogeneous hypoattenuating 7.6 x 7.1 x 4.4 cm masslike density posterior to the body of the stomach, with poor distinction of the gastric wall. Mild surrounding soft tissue inflammation is seen, with mildly prominent nodes measuring up to 1.0 cm in short axis. It abuts the pancreas, but appears to be separate from the pancreas. This is most concerning for a gastric malignancy, such as a GI stromal tumor, though a severe gastric ulceration might have a similar appearance. The remainder of the stomach is grossly unremarkable in appearance. The small bowel is grossly unremarkable. The appendix is normal in caliber, without evidence for appendicitis. Scattered diverticulosis is noted along the ascending, descending and sigmoid colon, without evidence of diverticulitis. Vascular/Lymphatic: Minimal calcification is seen along the abdominal aorta and its branches. No retroperitoneal  or pelvic sidewall lymphadenopathy is seen. Reproductive: The bladder is mildly distended and grossly unremarkable. The uterus is unremarkable  in appearance. An intrauterine device is noted in expected position at the fundus of the uterus. The ovaries are relatively symmetric, aside from a 2.6 cm left-sided follicle. Other: No additional soft tissue abnormalities are seen. Musculoskeletal: No acute osseous abnormalities are identified. The visualized musculature is unremarkable in appearance. IMPRESSION: 1. Heterogeneous hypoattenuating 7.6 x 7.1 x 4.4 cm masslike density posterior to the body of the stomach, with poor distinction of the gastric wall. Mild surrounding soft tissue inflammation, with mildly prominent nodes measuring up to 1.0 cm in short axis. This is most concerning for gastric malignancy, such as a GI stromal tumor, though a severe gastric ulceration might have a similar appearance. Endoscopy is recommended for further evaluation. 2. Scattered diverticulosis along the ascending, descending and sigmoid colon, without evidence of diverticulitis. Electronically Signed   By: Roanna Raider M.D.   On: 05/07/2017 18:34   Dg Chest Port 1 View  Result Date: 05/07/2017 CLINICAL DATA:  Shortness of breath. EXAM: PORTABLE CHEST 1 VIEW COMPARISON:  None. FINDINGS: The cardiomediastinal contours are normal. The lungs are clear. Pulmonary vasculature is normal. No consolidation, pleural effusion, or pneumothorax. No acute osseous abnormalities are seen. IMPRESSION: No acute pulmonary process. Electronically Signed   By: Rubye Oaks M.D.   On: 05/07/2017 22:46    Willette Cluster, NP-C @  05/08/2017, 9:28 AM  Pager number [phone]  GI ATTENDING  History, laboratories, x-rays reviewed. Patient seen and examined. Agree with comprehensive consultation note as outlined above. Plan EGD this morning to clarify CT abnormality.The nature of the procedure, as well as the risks, benefits, and alternatives  were carefully and thoroughly reviewed with the patient. Ample time for discussion and questions allowed. The patient understood, was satisfied, and agreed to proceed. She will likely need surgical consultation as well. Thank you  Wilhemina Bonito. Eda Keys., M.D. [facility] Division of Gastroenterology

## 2017-05-08 NOTE — Op Note (Signed)
[facility] Patient Name: Joyce Cline Procedure Date: 05/08/2017 MRN: [id number] Attending MD: Wilhemina Bonito. Marina Goodell , MD Date of Birth: 1971-05-24 CSN: [id number] Age: 46 Admit Type: Inpatient Procedure:                Upper GI endoscopy Indications:              Abdominal pain in the left upper quadrant, Abnormal                            CT of the GI tract Providers:                Wilhemina Bonito. Marina Goodell, MD, Janae Sauce. Steele Berg, RN, Priscella Mann, RN, Beryle Beams, Technician Referring MD:             [facility] Medicines:                Monitored Anesthesia Care, Fentanyl 50 micrograms                            IV, Midazolam 4 mg IV, Diphenhydramine 25 mg IV Complications:            No immediate complications. Estimated Blood Loss:     Estimated blood loss: none. Procedure:                Pre-Anesthesia Assessment:                           - Prior to the procedure, a History and Physical                            was performed, and patient medications and                            allergies were reviewed. The patient's tolerance of                            previous anesthesia was also reviewed. The risks                            and benefits of the procedure and the sedation                            options and risks were discussed with the patient.                            All questions were answered, and informed consent                            was obtained. Prior Anticoagulants: The patient has                            taken no previous anticoagulant or antiplatelet  agents. ASA Grade Assessment: II - A patient with                            mild systemic disease. After reviewing the risks                            and benefits, the patient was deemed in                            satisfactory condition to undergo the procedure.                           After obtaining informed  consent, the endoscope was                            passed under direct vision. Throughout the                            procedure, the patient's blood pressure, pulse, and                            oxygen saturations were monitored continuously. The                            [id number]) scope was introduced through the                            mouth, and advanced to the second part of duodenum.                            The upper GI endoscopy was accomplished without                            difficulty. The patient tolerated the procedure                            well. Scope In: Scope Out: Findings:      The esophagus was normal.      The stomach was normal. No mass or mucosal abnormality.      The examined duodenum was normal.      The cardia and gastric fundus were normal on retroflexion. Impression:               - Normal EGD. Moderate Sedation:      Moderate (conscious) sedation was administered by the endoscopy nurse       and supervised by the endoscopist. The following parameters were       monitored: oxygen saturation, heart rate, blood pressure, respiratory       rate, EKG, adequacy of pulmonary ventilation, and response to care.       Total physician intraservice time was 15 minutes. Recommendation:           -Surgical opinion regarding symptomatic abdominal                            mass.                           -  Resume previous diet.                           - Continue present medications.                           - Case discussed with patient and her mother.                            Procedure report provided to the patient. GI will                            sign off. Procedure Code(s):        --- Professional ---                           [phone], Esophagogastroduodenoscopy, flexible,                            transoral; diagnostic, including collection of                            specimen(s) by brushing or washing, when performed                             (separate procedure)                           99152, Moderate sedation services provided by the                            same physician or other qualified health care                            professional performing the diagnostic or                            therapeutic service that the sedation supports,                            requiring the presence of an independent trained                            observer to assist in the monitoring of the                            patient's level of consciousness and physiological                            status; initial 15 minutes of intraservice time,                            patient age 24 years or older Diagnosis Code(s):        --- Professional ---  R10.12, Left upper quadrant pain                           R93.3, Abnormal findings on diagnostic imaging of                            other parts of digestive tract CPT copyright 2016 [employer]. All rights reserved. The codes documented in this report are preliminary and upon coder review may  be revised to meet current compliance requirements. Wilhemina Bonito. Marina Goodell, MD 05/08/2017 11:45:32 AM This report has been signed electronically. Number of Addenda: 0

## 2017-05-08 NOTE — Progress Notes (Signed)
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 CT abdomen and pelvis done had an abnormality concerning for gastric mass. Joyce Cline seen in consultation by gastroenterology and underwent upper endoscopy on 05/08/2017 which was negative. General surgery consulted for further evaluation.   Assessment & Plan:   Active Problems:   Abdominal pain   Hypertriglyceridemia   NICM (nonischemic cardiomyopathy) (HCC)   History of pancreatitis   Abnormal CT of the abdomen   Anemia, chronic disease  #1 ?? gastric mass/left upper abdominal pain/abnormal CT abdomen Joyce Cline presented with left upper quadrant abdominal pain 11 days with no improvement with pain and some radiation to the back with some associated night sweats and shortness of breath to pain. Joyce Cline underwent upper endoscopy 05/08/2017 with a normal esophagus, normal stomach, normal duodenum, no mass or mucosal abnormality, cardiac gastric fundus normal. Consult with general surgery for further evaluation and management. Place on clear liquids and advance diet as tolerated. IV fluids. Pain management. Supportive care.  #2 history of nonischemic cardiomyopathy Stable. Continue home dose Coreg.  #3 history of chronic recurrent pancreatitis secondary to hypertriglyceridemia Remained stable. Abdominal pain unlikely to be coming from pancreatitis. Lipase levels within normal limits. Continue home regimen Creon.  #4 normocytic anemia Follow H&H.  #5 hypertension Continue Coreg.  #6 history of alcohol use Continue the Ativan withdrawal protocol.   DVT prophylaxis: SCDs Code Status: Full Family Communication: Updated Joyce Cline. No family at  bedside. Disposition Plan: Home once left upper quadrant abdominal pain has been improved and further evaluation of gastric mass and per general surgery.   Consultants:   Gastroenterology: Dr. Marina Goodell 05/08/2017  General surgery Dr. Drexel Iha 05/08/2017  Procedures:   CT abdomen and pelvis 05/07/2017  Chest x-ray 05/07/2017  Upper endoscopy 05/08/2017 per Dr. Marina Goodell  Antimicrobials:   None   Subjective: Joyce Cline denies any further nausea or emesis. Joyce Cline still complaining of left upper quadrant pain. Joyce Cline asking for food.  Objective: Vitals:   05/08/17 1150 05/08/17 1155 05/08/17 1200 05/08/17 1210  BP: 127/89  138/83 (!) 145/91  Pulse: 93 80 82 79  Resp: (!) 22 (!) 21 18 18   Temp:      TempSrc:      SpO2: 92% 95% 93% 95%  Weight:      Height:        Intake/Output Summary (Last 24 hours) at 05/08/17 1229 Last data filed at 05/08/17 0300  Gross per 24 hour  Intake           283.33 ml  Output                0 ml  Net           283.33 ml   Filed Weights   05/07/17 1551 05/07/17 2250 05/08/17 1051  Weight: 74.8 kg (165 lb) 74.8 kg (165 lb) 74.8 kg (165 lb)    Examination:  General exam: Appears calm and comfortable  Respiratory system: Clear to auscultation. Respiratory effort normal. Cardiovascular system: S1 & S2 heard, RRR. No JVD, murmurs, rubs, gallops or clicks. No pedal edema. Gastrointestinal system: Abdomen is nondistended, soft and tender to palpation left upper quadrant. No organomegaly or masses felt. Normal bowel sounds heard. Central nervous system: Alert and oriented.  No focal neurological deficits. Extremities: Symmetric 5 x 5 power. Skin: No rashes, lesions or ulcers Psychiatry: Judgement and insight appear normal. Mood & affect appropriate.     Data Reviewed: I have personally reviewed following labs and imaging studies  CBC:  Recent Labs Lab 05/07/17 1557 05/08/17 0355  WBC 9.8 7.2  HGB 11.2* 10.4*  HCT 33.0* 31.7*  MCV 97.3  98.4  PLT 316 280   Basic Metabolic Panel:  Recent Labs Lab 05/07/17 1557 05/08/17 0355  NA 138 135  K 3.9 3.6  CL 103 104  CO2 22 23  GLUCOSE 96 123*  BUN 10 5*  CREATININE 0.50 0.47  CALCIUM 10.0 8.7*  MG  --  2.0   GFR: Estimated Creatinine Clearance: 82.3 mL/min (by C-G formula based on SCr of 0.47 mg/dL). Liver Function Tests:  Recent Labs Lab 05/07/17 1557 05/08/17 0355  AST 16 17  ALT 19 16  ALKPHOS 72 64  BILITOT 0.8 0.6  PROT 8.0 6.9  ALBUMIN 4.1 3.5    Recent Labs Lab 05/07/17 1557  LIPASE 27   No results for input(s): AMMONIA in the last 168 hours. Coagulation Profile: No results for input(s): INR, PROTIME in the last 168 hours. Cardiac Enzymes: No results for input(s): CKTOTAL, CKMB, CKMBINDEX, TROPONINI in the last 168 hours. BNP (last 3 results) No results for input(s): PROBNP in the last 8760 hours. HbA1C: No results for input(s): HGBA1C in the last 72 hours. CBG:  Recent Labs Lab 05/08/17 0016 05/08/17 0619  GLUCAP 131* 95   Lipid Profile: No results for input(s): CHOL, HDL, LDLCALC, TRIG, CHOLHDL, LDLDIRECT in the last 72 hours. Thyroid Function Tests: No results for input(s): TSH, T4TOTAL, FREET4, T3FREE, THYROIDAB in the last 72 hours. Anemia Panel:  Recent Labs  05/08/17 0655  VITAMINB12 1,181*  FOLATE 35.2  FERRITIN 303  TIBC 337  IRON 43  RETICCTPCT 2.2   Sepsis Labs: No results for input(s): PROCALCITON, LATICACIDVEN in the last 168 hours.  No results found for this or any previous visit (from the past 240 hour(s)).       Radiology Studies: Ct Abdomen Pelvis W Contrast  Result Date: 05/07/2017 CLINICAL DATA:  Acute onset of generalized abdominal pain and distention. Nausea. Initial encounter. EXAM: CT ABDOMEN AND PELVIS WITH CONTRAST TECHNIQUE: Multidetector CT imaging of the abdomen and pelvis was performed using the standard protocol following bolus administration of intravenous contrast. CONTRAST:  100 mL  ISOVUE-300 IOPAMIDOL (ISOVUE-300) INJECTION 61% COMPARISON:  None. FINDINGS: Lower chest: The visualized lung bases are grossly clear. The visualized portions of the mediastinum are unremarkable. Hepatobiliary: The liver is unremarkable in appearance. The Joyce Cline is status post cholecystectomy, with clips noted at the gallbladder fossa. The common bile duct remains normal in caliber. Pancreas: The pancreas is grossly unremarkable in appearance. However, a vague hypoattenuating masslike density is noted anterior to the distal pancreatic body, described in further detail below. Spleen: The spleen is unremarkable in appearance. Adrenals/Urinary Tract: The adrenal glands are unremarkable in appearance. The kidneys are within normal limits. There is no evidence of hydronephrosis. No renal or ureteral stones are identified. No perinephric stranding is seen. Stomach/Bowel: There is a heterogeneous hypoattenuating 7.6 x 7.1 x 4.4 cm masslike density posterior to the body of the stomach, with poor distinction of the gastric wall. Mild surrounding soft tissue inflammation is seen, with mildly prominent nodes measuring up to 1.0 cm in short axis. It abuts the pancreas, but appears to be separate from the pancreas.  This is most concerning for a gastric malignancy, such as a GI stromal tumor, though a severe gastric ulceration might have a similar appearance. The remainder of the stomach is grossly unremarkable in appearance. The small bowel is grossly unremarkable. The appendix is normal in caliber, without evidence for appendicitis. Scattered diverticulosis is noted along the ascending, descending and sigmoid colon, without evidence of diverticulitis. Vascular/Lymphatic: Minimal calcification is seen along the abdominal aorta and its branches. No retroperitoneal or pelvic sidewall lymphadenopathy is seen. Reproductive: The bladder is mildly distended and grossly unremarkable. The uterus is unremarkable in appearance. An  intrauterine device is noted in expected position at the fundus of the uterus. The ovaries are relatively symmetric, aside from a 2.6 cm left-sided follicle. Other: No additional soft tissue abnormalities are seen. Musculoskeletal: No acute osseous abnormalities are identified. The visualized musculature is unremarkable in appearance. IMPRESSION: 1. Heterogeneous hypoattenuating 7.6 x 7.1 x 4.4 cm masslike density posterior to the body of the stomach, with poor distinction of the gastric wall. Mild surrounding soft tissue inflammation, with mildly prominent nodes measuring up to 1.0 cm in short axis. This is most concerning for gastric malignancy, such as a GI stromal tumor, though a severe gastric ulceration might have a similar appearance. Endoscopy is recommended for further evaluation. 2. Scattered diverticulosis along the ascending, descending and sigmoid colon, without evidence of diverticulitis. Electronically Signed   By: Roanna Raider M.D.   On: 05/07/2017 18:34   Dg Chest Port 1 View  Result Date: 05/07/2017 CLINICAL DATA:  Shortness of breath. EXAM: PORTABLE CHEST 1 VIEW COMPARISON:  None. FINDINGS: The cardiomediastinal contours are normal. The lungs are clear. Pulmonary vasculature is normal. No consolidation, pleural effusion, or pneumothorax. No acute osseous abnormalities are seen. IMPRESSION: No acute pulmonary process. Electronically Signed   By: Rubye Oaks M.D.   On: 05/07/2017 22:46        Scheduled Meds: . carvedilol  25 mg Oral BID  . folic acid  1 mg Oral Daily  . LORazepam  0-4 mg Intravenous Q6H   Followed by  . [START ON 05/10/2017] LORazepam  0-4 mg Intravenous Q12H  . magnesium oxide  400 mg Oral QHS  . multivitamin with minerals  1 tablet Oral Daily  . pantoprazole (PROTONIX) IV  40 mg Intravenous Q12H  . thiamine  100 mg Oral Daily   Or  . thiamine  100 mg Intravenous Daily   Continuous Infusions: . sodium chloride 100 mL/hr at 05/08/17 0953     LOS: 0  days    Time spent: 35 minutes    Teairra Millar, MD [facility] Pager [phone]  If 7PM-7AM, please contact night-coverage [email or web address] Password [email or web address] 05/08/2017, 12:29 PM

## 2017-05-09 DIAGNOSIS — K859 Acute pancreatitis without necrosis or infection, unspecified: Secondary | ICD-10-CM

## 2017-05-09 LAB — GLUCOSE, CAPILLARY
GLUCOSE-CAPILLARY: 141 mg/dL — AB (ref 65–99)
Glucose-Capillary: 100 mg/dL — ABNORMAL HIGH (ref 65–99)
Glucose-Capillary: 80 mg/dL (ref 65–99)
Glucose-Capillary: 92 mg/dL (ref 65–99)
Glucose-Capillary: 93 mg/dL (ref 65–99)

## 2017-05-09 LAB — BASIC METABOLIC PANEL
Anion gap: 7 (ref 5–15)
BUN: 5 mg/dL — AB (ref 6–20)
CHLORIDE: 107 mmol/L (ref 101–111)
CO2: 24 mmol/L (ref 22–32)
Calcium: 9 mg/dL (ref 8.9–10.3)
Creatinine, Ser: 0.42 mg/dL — ABNORMAL LOW (ref 0.44–1.00)
GFR calc Af Amer: >60 mL/min (ref >60)
GFR calc non Af Amer: >60 mL/min (ref >60)
GLUCOSE: 91 mg/dL (ref 65–99)
POTASSIUM: 3.8 mmol/L (ref 3.5–5.1)
SODIUM: 138 mmol/L (ref 135–145)

## 2017-05-09 LAB — CBC WITH DIFFERENTIAL/PLATELET
Basophils Absolute: 0 10*3/uL (ref 0.0–0.1)
Basophils Relative: 0 %
Eosinophils Absolute: 0.1 10*3/uL (ref 0.0–0.7)
Eosinophils Relative: 2 %
HEMATOCRIT: 31.5 % — AB (ref 36.0–46.0)
HEMOGLOBIN: 10.3 g/dL — AB (ref 12.0–15.0)
LYMPHS ABS: 2.5 10*3/uL (ref 0.7–4.0)
LYMPHS PCT: 36 %
MCH: 32.4 pg (ref 26.0–34.0)
MCHC: 32.7 g/dL (ref 30.0–36.0)
MCV: 99.1 fL (ref 78.0–100.0)
Monocytes Absolute: 0.4 10*3/uL (ref 0.1–1.0)
Monocytes Relative: 5 %
NEUTROS ABS: 3.9 10*3/uL (ref 1.7–7.7)
NEUTROS PCT: 57 %
Platelets: 292 10*3/uL (ref 150–400)
RBC: 3.18 MIL/uL — AB (ref 3.87–5.11)
RDW: 12.9 % (ref 11.5–15.5)
WBC: 6.9 10*3/uL (ref 4.0–10.5)

## 2017-05-09 LAB — TRIGLYCERIDES: Triglycerides: 553 mg/dL — ABNORMAL HIGH (ref <150)

## 2017-05-09 LAB — C-REACTIVE PROTEIN: CRP: 1.4 mg/dL — AB (ref <1.0)

## 2017-05-09 MED ORDER — BOOST / RESOURCE BREEZE PO LIQD
1.0000 | Freq: Three times a day (TID) | ORAL | Status: DC
  Administered 2017-05-09: 1 via ORAL

## 2017-05-09 MED ORDER — LORAZEPAM 0.5 MG PO TABS: 0.5000 mg | ORAL_TABLET | Freq: Four times a day (QID) | ORAL | Status: DC | PRN

## 2017-05-09 MED ORDER — PANTOPRAZOLE SODIUM 40 MG PO TBEC
40.0000 mg | DELAYED_RELEASE_TABLET | Freq: Two times a day (BID) | ORAL | Status: DC
  Administered 2017-05-09 – 2017-05-10 (×3): 40 mg via ORAL
  Filled 2017-05-09 (×3): qty 1

## 2017-05-09 NOTE — Progress Notes (Signed)
PROGRESS NOTE    Joyce Cline  GBE:[id number] DOB: 1971/12/25 DOA: 05/07/2017 PCP: Patient, No Pcp Per    Brief Narrative:  46 yo female presents with the chief complain of abdominal pain, worsening symptoms for the last 4 days, left upper quadrant, radiated to the back and associated with nausea. Patient know to have chronic pancreatitis due to hypertriglyceridemia, requiring plasmapheresis. On the initial examination, patient hemodynamically stable, blood pressure 145/87 with heart rate 96 and respiratory rate 18.  Heart S1-S2 present, lungs were clear to auscultation, her abdomen was soft, non tender and non distended. Abdominal CT with gastric mass. Patient admitted for pain control and further workup. Gastroenterology was consulted. EGD with no gastris mass, surgery consulted.      Assessment & Plan:   Active Problems:   Abdominal pain   Hypertriglyceridemia   NICM (nonischemic cardiomyopathy) (HCC)   History of pancreatitis   Abnormal CT of the abdomen   Anemia, chronic disease   Gastric mass   1. Abdominal mass . Case discussed with surgery, mass probably related to lesser omentum and fat overlying the pancreas continue conservative care and further imaging as outpatient.   2. Acute on chronic pancreatitis due to hypertriglyceridemia. Will continue to keep patient NPO, maintenance IV fluids, IV analgesics, IV antiemetics and IV antiacids, follow on tryglyceride level, patient did not tolerate fenofibrate in the past due to side effects.    3. Non ischemic cardiomyopathy. No signs of volume overload, will continue gentle hydration will need to request records from outpatient clinic on Monday, [place].   4. Anemia. Iron studies with ferritin 303, serum iron 43 with tibc 337 and transferrin saturation 13, cw combined anemia of chronic disease with iron deficiency. Will use po ferrous sulfate when able to tolerate po.  5. HTN. Blood  pressure stable at 140, will continue close monitoring, continue coreg.    DVT prophylaxis: scd Code Status: Full  Family Communication:  Disposition Plan:  Consultants:   Gastroenterology   Surgery   Procedures:     Antimicrobials:       Subjective: Patient feeling better, pain still persistent, moderate in intensity, worse to touch or movement, no nausea or vomiting, continue to be npo, lowe appetite, no dyspnea or chest pain. Positive bowel movement.   Objective: Vitals:   05/08/17 1359 05/08/17 2206 05/09/17 0445 05/09/17 0600  BP: 130/79 (!) 141/96  138/88  Pulse: 83 89 68 80  Resp: 18 16  20   Temp: 98.3 F (36.8 C) 98.6 F (37 C)  98.9 F (37.2 C)  TempSrc: Oral Oral  Oral  SpO2: 97% 97%  95%  Weight:      Height:        Intake/Output Summary (Last 24 hours) at 05/09/17 0937 Last data filed at 05/08/17 2300  Gross per 24 hour  Intake             1010 ml  Output                0 ml  Net             1010 ml   Filed Weights   05/07/17 1551 05/07/17 2250 05/08/17 1051  Weight: 74.8 kg (165 lb) 74.8 kg (165 lb) 74.8 kg (165 lb)    Examination:  General exam: deconditioned  E ENT. Mild pallor, no icterus, oral mucosa moist.  Respiratory system: Clear to auscultation. No wheezing, rales or rhonchi.  Cardiovascular system: S1 &  S2 heard, RRR. No JVD, murmurs, rubs, gallops or clicks. No pedal edema. Gastrointestinal system: Abdomen mildly distended, tender to superficial palpation, no rebound or gurading. No organomegaly or masses felt. Normal bowel sounds heard. Central nervous system: Alert and oriented. No focal neurological deficits. Extremities: Symmetric 5 x 5 power. Skin: No rashes, lesions or ulcers     Data Reviewed: I have personally reviewed following labs and imaging studies  CBC:  Recent Labs Lab 05/07/17 1557 05/08/17 0355 05/09/17 0436  WBC 9.8 7.2 6.9  NEUTROABS  --   --  3.9  HGB 11.2* 10.4* 10.3*  HCT 33.0* 31.7* 31.5*    MCV 97.3 98.4 99.1  PLT 316 280 292   Basic Metabolic Panel:  Recent Labs Lab 05/07/17 1557 05/08/17 0355 05/09/17 0436  NA 138 135 138  K 3.9 3.6 3.8  CL 103 104 107  CO2 22 23 24   GLUCOSE 96 123* 91  BUN 10 5* 5*  CREATININE 0.50 0.47 0.42*  CALCIUM 10.0 8.7* 9.0  MG  --  2.0  --    GFR: Estimated Creatinine Clearance: 82.3 mL/min (A) (by C-G formula based on SCr of 0.42 mg/dL (L)). Liver Function Tests:  Recent Labs Lab 05/07/17 1557 05/08/17 0355  AST 16 17  ALT 19 16  ALKPHOS 72 64  BILITOT 0.8 0.6  PROT 8.0 6.9  ALBUMIN 4.1 3.5    Recent Labs Lab 05/07/17 1557  LIPASE 27   No results for input(s): AMMONIA in the last 168 hours. Coagulation Profile: No results for input(s): INR, PROTIME in the last 168 hours. Cardiac Enzymes: No results for input(s): CKTOTAL, CKMB, CKMBINDEX, TROPONINI in the last 168 hours. BNP (last 3 results) No results for input(s): PROBNP in the last 8760 hours. HbA1C: No results for input(s): HGBA1C in the last 72 hours. CBG:  Recent Labs Lab 05/08/17 0619 05/08/17 1232 05/08/17 1758 05/09/17 0013 05/09/17 0642  GLUCAP 95 89 125* 92 93   Lipid Profile: No results for input(s): CHOL, HDL, LDLCALC, TRIG, CHOLHDL, LDLDIRECT in the last 72 hours. Thyroid Function Tests: No results for input(s): TSH, T4TOTAL, FREET4, T3FREE, THYROIDAB in the last 72 hours. Anemia Panel:  Recent Labs  05/08/17 0655  VITAMINB12 1,181*  FOLATE 35.2  FERRITIN 303  TIBC 337  IRON 43  RETICCTPCT 2.2   Sepsis Labs: No results for input(s): PROCALCITON, LATICACIDVEN in the last 168 hours.  No results found for this or any previous visit (from the past 240 hour(s)).       Radiology Studies: Ct Abdomen Pelvis W Contrast  Result Date: 05/07/2017 CLINICAL DATA:  Acute onset of generalized abdominal pain and distention. Nausea. Initial encounter. EXAM: CT ABDOMEN AND PELVIS WITH CONTRAST TECHNIQUE: Multidetector CT imaging of the  abdomen and pelvis was performed using the standard protocol following bolus administration of intravenous contrast. CONTRAST:  100 mL ISOVUE-300 IOPAMIDOL (ISOVUE-300) INJECTION 61% COMPARISON:  None. FINDINGS: Lower chest: The visualized lung bases are grossly clear. The visualized portions of the mediastinum are unremarkable. Hepatobiliary: The liver is unremarkable in appearance. The patient is status post cholecystectomy, with clips noted at the gallbladder fossa. The common bile duct remains normal in caliber. Pancreas: The pancreas is grossly unremarkable in appearance. However, a vague hypoattenuating masslike density is noted anterior to the distal pancreatic body, described in further detail below. Spleen: The spleen is unremarkable in appearance. Adrenals/Urinary Tract: The adrenal glands are unremarkable in appearance. The kidneys are within normal limits. There is no evidence of  hydronephrosis. No renal or ureteral stones are identified. No perinephric stranding is seen. Stomach/Bowel: There is a heterogeneous hypoattenuating 7.6 x 7.1 x 4.4 cm masslike density posterior to the body of the stomach, with poor distinction of the gastric wall. Mild surrounding soft tissue inflammation is seen, with mildly prominent nodes measuring up to 1.0 cm in short axis. It abuts the pancreas, but appears to be separate from the pancreas. This is most concerning for a gastric malignancy, such as a GI stromal tumor, though a severe gastric ulceration might have a similar appearance. The remainder of the stomach is grossly unremarkable in appearance. The small bowel is grossly unremarkable. The appendix is normal in caliber, without evidence for appendicitis. Scattered diverticulosis is noted along the ascending, descending and sigmoid colon, without evidence of diverticulitis. Vascular/Lymphatic: Minimal calcification is seen along the abdominal aorta and its branches. No retroperitoneal or pelvic sidewall  lymphadenopathy is seen. Reproductive: The bladder is mildly distended and grossly unremarkable. The uterus is unremarkable in appearance. An intrauterine device is noted in expected position at the fundus of the uterus. The ovaries are relatively symmetric, aside from a 2.6 cm left-sided follicle. Other: No additional soft tissue abnormalities are seen. Musculoskeletal: No acute osseous abnormalities are identified. The visualized musculature is unremarkable in appearance. IMPRESSION: 1. Heterogeneous hypoattenuating 7.6 x 7.1 x 4.4 cm masslike density posterior to the body of the stomach, with poor distinction of the gastric wall. Mild surrounding soft tissue inflammation, with mildly prominent nodes measuring up to 1.0 cm in short axis. This is most concerning for gastric malignancy, such as a GI stromal tumor, though a severe gastric ulceration might have a similar appearance. Endoscopy is recommended for further evaluation. 2. Scattered diverticulosis along the ascending, descending and sigmoid colon, without evidence of diverticulitis. Electronically Signed   By: Roanna Raider M.D.   On: 05/07/2017 18:34   Dg Chest Port 1 View  Result Date: 05/07/2017 CLINICAL DATA:  Shortness of breath. EXAM: PORTABLE CHEST 1 VIEW COMPARISON:  None. FINDINGS: The cardiomediastinal contours are normal. The lungs are clear. Pulmonary vasculature is normal. No consolidation, pleural effusion, or pneumothorax. No acute osseous abnormalities are seen. IMPRESSION: No acute pulmonary process. Electronically Signed   By: Rubye Oaks M.D.   On: 05/07/2017 22:46        Scheduled Meds: . carvedilol  25 mg Oral BID  . folic acid  1 mg Oral Daily  . lipase/protease/amylase  12,000 Units Oral TID WC  . LORazepam  0-4 mg Intravenous Q6H   Followed by  . [START ON 05/10/2017] LORazepam  0-4 mg Intravenous Q12H  . magnesium oxide  400 mg Oral QHS  . multivitamin with minerals  1 tablet Oral Daily  . omega-3 acid ethyl  esters  1 g Oral Daily  . pantoprazole  40 mg Oral BID  . thiamine  100 mg Oral Daily   Or  . thiamine  100 mg Intravenous Daily   Continuous Infusions:   LOS: 1 day       Mauricio Annett Gula, MD [facility] Pager [phone]  If 7PM-7AM, please contact night-coverage [email or web address] Password [email or web address] 05/09/2017, 9:37 AM

## 2017-05-09 NOTE — Progress Notes (Signed)
Assessment Active Problems:   Abdominal pain-could be due to recurrence of hyperTG pancreatitis   History of pancreatitis   Inflammatory mass in lesser sac   Hypertriglyceridemia- TGs run in t he 2000s by her report; she was getting plasmaphoresis treatments in [place]  Plan:  Check TG level.  Check CEA and Ca 19-9.   LOS: 1 day     1 Day Post-Op  Chief Complaint/Subjective: More nausea with full liquids  Objective: Vital signs in last 24 hours: Temp:  [97.7 F (36.5 C)-98.9 F (37.2 C)] 98.9 F (37.2 C) (05/12 0600) Pulse Rate:  [68-96] 80 (05/12 0600) Resp:  [11-25] 20 (05/12 0600) BP: (127-196)/(79-146) 138/88 (05/12 0600) SpO2:  [91 %-100 %] 95 % (05/12 0600) Weight:  [74.8 kg (165 lb)] 74.8 kg (165 lb) (05/11 1051) Last BM Date: 05/08/17  Intake/Output from previous day: 05/11 0701 - 05/12 0700 In: 1010 [P.O.:1010] Out: -  Intake/Output this shift: No intake/output data recorded.  PE: General- In NAD.  Awake and alert. Abdomen-soft, mild to moderate epigastric tenderness  Lab Results:   Recent Labs  05/08/17 0355 05/09/17 0436  WBC 7.2 6.9  HGB 10.4* 10.3*  HCT 31.7* 31.5*  PLT 280 292   BMET  Recent Labs  05/08/17 0355 05/09/17 0436  NA 135 138  K 3.6 3.8  CL 104 107  CO2 23 24  GLUCOSE 123* 91  BUN 5* 5*  CREATININE 0.47 0.42*  CALCIUM 8.7* 9.0   PT/INR No results for input(s): LABPROT, INR in the last 72 hours. Comprehensive Metabolic Panel:    Component Value Date/Time   NA 138 05/09/2017 0436   NA 135 05/08/2017 0355   K 3.8 05/09/2017 0436   K 3.6 05/08/2017 0355   CL 107 05/09/2017 0436   CL 104 05/08/2017 0355   CO2 24 05/09/2017 0436   CO2 23 05/08/2017 0355   BUN 5 (L) 05/09/2017 0436   BUN 5 (L) 05/08/2017 0355   CREATININE 0.42 (L) 05/09/2017 0436   CREATININE 0.47 05/08/2017 0355   GLUCOSE 91 05/09/2017 0436   GLUCOSE 123 (H) 05/08/2017 0355   CALCIUM 9.0 05/09/2017 0436   CALCIUM 8.7 (L) 05/08/2017 0355   AST 17  05/08/2017 0355   AST 16 05/07/2017 1557   ALT 16 05/08/2017 0355   ALT 19 05/07/2017 1557   ALKPHOS 64 05/08/2017 0355   ALKPHOS 72 05/07/2017 1557   BILITOT 0.6 05/08/2017 0355   BILITOT 0.8 05/07/2017 1557   PROT 6.9 05/08/2017 0355   PROT 8.0 05/07/2017 1557   ALBUMIN 3.5 05/08/2017 0355   ALBUMIN 4.1 05/07/2017 1557     Studies/Results: Ct Abdomen Pelvis W Contrast  Result Date: 05/07/2017 CLINICAL DATA:  Acute onset of generalized abdominal pain and distention. Nausea. Initial encounter. EXAM: CT ABDOMEN AND PELVIS WITH CONTRAST TECHNIQUE: Multidetector CT imaging of the abdomen and pelvis was performed using the standard protocol following bolus administration of intravenous contrast. CONTRAST:  100 mL ISOVUE-300 IOPAMIDOL (ISOVUE-300) INJECTION 61% COMPARISON:  None. FINDINGS: Lower chest: The visualized lung bases are grossly clear. The visualized portions of the mediastinum are unremarkable. Hepatobiliary: The liver is unremarkable in appearance. The patient is status post cholecystectomy, with clips noted at the gallbladder fossa. The common bile duct remains normal in caliber. Pancreas: The pancreas is grossly unremarkable in appearance. However, a vague hypoattenuating masslike density is noted anterior to the distal pancreatic body, described in further detail below. Spleen: The spleen is unremarkable in appearance. Adrenals/Urinary Tract: The  adrenal glands are unremarkable in appearance. The kidneys are within normal limits. There is no evidence of hydronephrosis. No renal or ureteral stones are identified. No perinephric stranding is seen. Stomach/Bowel: There is a heterogeneous hypoattenuating 7.6 x 7.1 x 4.4 cm masslike density posterior to the body of the stomach, with poor distinction of the gastric wall. Mild surrounding soft tissue inflammation is seen, with mildly prominent nodes measuring up to 1.0 cm in short axis. It abuts the pancreas, but appears to be separate from the  pancreas. This is most concerning for a gastric malignancy, such as a GI stromal tumor, though a severe gastric ulceration might have a similar appearance. The remainder of the stomach is grossly unremarkable in appearance. The small bowel is grossly unremarkable. The appendix is normal in caliber, without evidence for appendicitis. Scattered diverticulosis is noted along the ascending, descending and sigmoid colon, without evidence of diverticulitis. Vascular/Lymphatic: Minimal calcification is seen along the abdominal aorta and its branches. No retroperitoneal or pelvic sidewall lymphadenopathy is seen. Reproductive: The bladder is mildly distended and grossly unremarkable. The uterus is unremarkable in appearance. An intrauterine device is noted in expected position at the fundus of the uterus. The ovaries are relatively symmetric, aside from a 2.6 cm left-sided follicle. Other: No additional soft tissue abnormalities are seen. Musculoskeletal: No acute osseous abnormalities are identified. The visualized musculature is unremarkable in appearance. IMPRESSION: 1. Heterogeneous hypoattenuating 7.6 x 7.1 x 4.4 cm masslike density posterior to the body of the stomach, with poor distinction of the gastric wall. Mild surrounding soft tissue inflammation, with mildly prominent nodes measuring up to 1.0 cm in short axis. This is most concerning for gastric malignancy, such as a GI stromal tumor, though a severe gastric ulceration might have a similar appearance. Endoscopy is recommended for further evaluation. 2. Scattered diverticulosis along the ascending, descending and sigmoid colon, without evidence of diverticulitis. Electronically Signed   By: Roanna Raider M.D.   On: 05/07/2017 18:34   Dg Chest Port 1 View  Result Date: 05/07/2017 CLINICAL DATA:  Shortness of breath. EXAM: PORTABLE CHEST 1 VIEW COMPARISON:  None. FINDINGS: The cardiomediastinal contours are normal. The lungs are clear. Pulmonary vasculature  is normal. No consolidation, pleural effusion, or pneumothorax. No acute osseous abnormalities are seen. IMPRESSION: No acute pulmonary process. Electronically Signed   By: Rubye Oaks M.D.   On: 05/07/2017 22:46    Anti-infectives: Anti-infectives    None       Louie Meaders J 05/09/2017

## 2017-05-10 DIAGNOSIS — K858 Other acute pancreatitis without necrosis or infection: Principal | ICD-10-CM

## 2017-05-10 LAB — CBC WITH DIFFERENTIAL/PLATELET
BASOS ABS: 0 10*3/uL (ref 0.0–0.1)
Basophils Relative: 0 %
EOS ABS: 0.2 10*3/uL (ref 0.0–0.7)
EOS PCT: 2 %
HCT: 30.7 % — ABNORMAL LOW (ref 36.0–46.0)
HEMOGLOBIN: 10.2 g/dL — AB (ref 12.0–15.0)
LYMPHS PCT: 33 %
Lymphs Abs: 2.6 10*3/uL (ref 0.7–4.0)
MCH: 32.4 pg (ref 26.0–34.0)
MCHC: 33.2 g/dL (ref 30.0–36.0)
MCV: 97.5 fL (ref 78.0–100.0)
Monocytes Absolute: 0.5 10*3/uL (ref 0.1–1.0)
Monocytes Relative: 6 %
Neutro Abs: 4.7 10*3/uL (ref 1.7–7.7)
Neutrophils Relative %: 59 %
PLATELETS: 295 10*3/uL (ref 150–400)
RBC: 3.15 MIL/uL — AB (ref 3.87–5.11)
RDW: 12.8 % (ref 11.5–15.5)
WBC: 8 10*3/uL (ref 4.0–10.5)

## 2017-05-10 LAB — BASIC METABOLIC PANEL
Anion gap: 8 (ref 5–15)
CO2: 25 mmol/L (ref 22–32)
Calcium: 9.3 mg/dL (ref 8.9–10.3)
Chloride: 105 mmol/L (ref 101–111)
Creatinine, Ser: 0.46 mg/dL (ref 0.44–1.00)
GFR calc non Af Amer: >60 mL/min (ref >60)
Glucose, Bld: 111 mg/dL — ABNORMAL HIGH (ref 65–99)
POTASSIUM: 3.8 mmol/L (ref 3.5–5.1)
SODIUM: 138 mmol/L (ref 135–145)

## 2017-05-10 LAB — CANCER ANTIGEN 19-9: CA 19 9: 5 U/mL (ref 0–35)

## 2017-05-10 LAB — GLUCOSE, CAPILLARY
GLUCOSE-CAPILLARY: 101 mg/dL — AB (ref 65–99)
GLUCOSE-CAPILLARY: 99 mg/dL (ref 65–99)
Glucose-Capillary: 106 mg/dL — ABNORMAL HIGH (ref 65–99)
Glucose-Capillary: 110 mg/dL — ABNORMAL HIGH (ref 65–99)

## 2017-05-10 LAB — CEA: CEA: 0.5 ng/mL (ref 0.0–4.7)

## 2017-05-10 MED ORDER — DEXTROSE-NACL 5-0.9 % IV SOLN
INTRAVENOUS | Status: DC
  Administered 2017-05-10 – 2017-05-11 (×3): via INTRAVENOUS

## 2017-05-10 MED ORDER — FAMOTIDINE IN NACL 20-0.9 MG/50ML-% IV SOLN
20.0000 mg | Freq: Two times a day (BID) | INTRAVENOUS | Status: DC
  Administered 2017-05-10 – 2017-05-14 (×8): 20 mg via INTRAVENOUS
  Filled 2017-05-10 (×10): qty 50

## 2017-05-10 NOTE — Progress Notes (Signed)
PROGRESS NOTE    Joyce Cline  EAV:[id number] DOB: 20-Oct-1971 DOA: 05/07/2017 PCP: Patient, No Pcp Per    Brief Narrative:  46 yo female presents with the chief complain of abdominal pain, worsening symptoms for the last 4 days, left upper quadrant, radiated to the back and associated with nausea. Patient know to have chronic pancreatitis due to hypertriglyceridemia, requiring plasmapheresis. On the initial examination, patient hemodynamically stable, blood pressure 145/87 with heart rate 96 and respiratory rate 18.  Heart S1-S2 present, lungs were clear to auscultation, her abdomen was soft, non tender and non distended. Abdominal CT with gastric mass. Patient admitted for pain control and further workup. Gastroenterology was consulted. EGD with no gastric mass, surgery consulted. Recommendations for conservative management.    Assessment & Plan:   Active Problems:   Abdominal pain   Hypertriglyceridemia   NICM (nonischemic cardiomyopathy) (HCC)   History of pancreatitis   Abnormal CT of the abdomen   Anemia, chronic disease   Gastric mass    1. Abdominal mass. Suspected to be related to acute on chronic pancreatitis, will continue conservative management, may need re-imaging as outpatient.   2. Acute on chronic pancreatitis due to hypertriglyceridemia. Patient with worsening pain and distention, serum triglycerides at 553, in the past has been on the 1000's. Continue IV fluids and supportive medical management with IV antiemetics, analgesics and antiacids. If no improvement will consider PICC line for TPN, at this point patient prefer's to avoid central line placement. Continue pancreatic enzymes.   3. Non ischemic cardiomyopathy. Continue hydration with IV fluids, no signs of volume overload.   4. 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 No critical hb and hct, will hold iron supplements until patient tolerates PO.   5. HTN. Blood pressure continue to be  stable at 140's. Patient on coreg. As needed hydralazine.   6. Dyslipidemia. Will order complete lipid panel. Triglycerides up to 500, patient has been on fenofibrate and gemfibrozil. Apparently did not tolerate gemfibrozil in the past, describes symptoms  consistent with rhabdomyolysis. Will plan to start patient on niacin once able to tolerate po.   DVT prophylaxis: scd Code Status: Full  Family Communication:  Disposition Plan:  Consultants:   Gastroenterology   Surgery   Procedures:     Antimicrobials:    Subjective: Patient with worsening abdominal pain, epigastric, dull in nature, no radiation, worse with meals, improved with analgesics. No nausea or vomiting. Last fasting triglyceride in the 1000's.   Objective: Vitals:   05/09/17 1337 05/09/17 2100 05/09/17 2204 05/10/17 0457  BP: (!) 140/99 (!) 151/101 (!) 157/96 140/82  Pulse: 80 86 83 86  Resp: 18 18 18 18   Temp: 99 F (37.2 C) 98.8 F (37.1 C)  98.5 F (36.9 C)  TempSrc: Oral Oral  Oral  SpO2: 92% 94% 99% 98%  Weight:      Height:       No intake or output data in the 24 hours ending 05/10/17 1139 Filed Weights   05/07/17 1551 05/07/17 2250 05/08/17 1051  Weight: 74.8 kg (165 lb) 74.8 kg (165 lb) 74.8 kg (165 lb)    Examination:  General exam: in pain. E ENT: mild pallor, no icterus, oral mucosa dry.  Respiratory system: No wheezing, rales or rhonchi. Respiratory effort normal. Cardiovascular system: S1 & S2 heard, RRR. No JVD, murmurs, rubs, gallops or clicks. No pedal edema. Gastrointestinal system: Abdomen is distended and tender to superficial palpation. No organomegaly or masses felt. Normal bowel sounds are  decreased.  Central nervous system: Alert and oriented. No focal neurological deficits. Extremities: Symmetric 5 x 5 power. Skin: No rashes, lesions or ulcers   Data Reviewed: I have personally reviewed following labs and imaging studies  CBC:  Recent Labs Lab 05/07/17 1557  05/08/17 0355 05/09/17 0436 05/10/17 0419  WBC 9.8 7.2 6.9 8.0  NEUTROABS  --   --  3.9 4.7  HGB 11.2* 10.4* 10.3* 10.2*  HCT 33.0* 31.7* 31.5* 30.7*  MCV 97.3 98.4 99.1 97.5  PLT 316 280 292 295   Basic Metabolic Panel:  Recent Labs Lab 05/07/17 1557 05/08/17 0355 05/09/17 0436 05/10/17 0419  NA 138 135 138 138  K 3.9 3.6 3.8 3.8  CL 103 104 107 105  CO2 22 23 24 25   GLUCOSE 96 123* 91 111*  BUN 10 5* 5* <5*  CREATININE 0.50 0.47 0.42* 0.46  CALCIUM 10.0 8.7* 9.0 9.3  MG  --  2.0  --   --    GFR: Estimated Creatinine Clearance: 82.3 mL/min (by C-G formula based on SCr of 0.46 mg/dL). Liver Function Tests:  Recent Labs Lab 05/07/17 1557 05/08/17 0355  AST 16 17  ALT 19 16  ALKPHOS 72 64  BILITOT 0.8 0.6  PROT 8.0 6.9  ALBUMIN 4.1 3.5    Recent Labs Lab 05/07/17 1557  LIPASE 27   No results for input(s): AMMONIA in the last 168 hours. Coagulation Profile: No results for input(s): INR, PROTIME in the last 168 hours. Cardiac Enzymes: No results for input(s): CKTOTAL, CKMB, CKMBINDEX, TROPONINI in the last 168 hours. BNP (last 3 results) No results for input(s): PROBNP in the last 8760 hours. HbA1C: No results for input(s): HGBA1C in the last 72 hours. CBG:  Recent Labs Lab 05/09/17 0642 05/09/17 1203 05/09/17 1756 05/09/17 2307 05/10/17 0437  GLUCAP 93 80 141* 100* 101*   Lipid Profile:  Recent Labs  05/09/17 0436  TRIG 553*   Thyroid Function Tests: No results for input(s): TSH, T4TOTAL, FREET4, T3FREE, THYROIDAB in the last 72 hours. Anemia Panel:  Recent Labs  05/08/17 0655  VITAMINB12 1,181*  FOLATE 35.2  FERRITIN 303  TIBC 337  IRON 43  RETICCTPCT 2.2   Sepsis Labs: No results for input(s): PROCALCITON, LATICACIDVEN in the last 168 hours.  No results found for this or any previous visit (from the past 240 hour(s)).       Radiology Studies: No results found.      Scheduled Meds: . carvedilol  25 mg Oral BID    . feeding supplement  1 Container Oral TID BM  . folic acid  1 mg Oral Daily  . lipase/protease/amylase  12,000 Units Oral TID WC  . magnesium oxide  400 mg Oral QHS  . multivitamin with minerals  1 tablet Oral Daily  . omega-3 acid ethyl esters  1 g Oral Daily  . pantoprazole  40 mg Oral BID  . thiamine  100 mg Oral Daily   Or  . thiamine  100 mg Intravenous Daily   Continuous Infusions:   LOS: 2 days       Janeece Blok Annett Gula, MD [facility] Pager [phone]  If 7PM-7AM, please contact night-coverage [email or web address] Password [facility] 05/10/2017, 11:39 AM

## 2017-05-10 NOTE — Progress Notes (Signed)
Assessment Active Problems:   Abdominal pain-could be due to recurrence of hyperTG pancreatitis; TG = 553   History of chronic pancreatitis   Inflammatory mass in lesser sac-CEA and CA 19-9 pending   Hypertriglyceridemia- TG elevated as above  Plan:  Recommend inserting PICC and started TPN.  If this improves her symptoms then she likely has acute on chronic pancreatitis caused by hypertriglyceridemia.  Would also have her seen by someone for her hypertriglyceridemia.   LOS: 2 days     2 Days Post-Op  Chief Complaint/Subjective: She reports increased epigastric pain when she eats or moves.   Objective: Vital signs in last 24 hours: Temp:  [98.5 F (36.9 C)-99 F (37.2 C)] 98.5 F (36.9 C) (05/13 0457) Pulse Rate:  [80-86] 86 (05/13 0457) Resp:  [18] 18 (05/13 0457) BP: (140-157)/(82-101) 140/82 (05/13 0457) SpO2:  [92 %-99 %] 98 % (05/13 0457) Last BM Date: 05/08/17  Intake/Output from previous day: No intake/output data recorded. Intake/Output this shift: No intake/output data recorded.  PE: General- In NAD.  Awake and alert. Abdomen-soft, mild to moderate epigastric tenderness  Lab Results:   Recent Labs  05/09/17 0436 05/10/17 0419  WBC 6.9 8.0  HGB 10.3* 10.2*  HCT 31.5* 30.7*  PLT 292 295   BMET  Recent Labs  05/09/17 0436 05/10/17 0419  NA 138 138  K 3.8 3.8  CL 107 105  CO2 24 25  GLUCOSE 91 111*  BUN 5* <5*  CREATININE 0.42* 0.46  CALCIUM 9.0 9.3   PT/INR No results for input(s): LABPROT, INR in the last 72 hours. Comprehensive Metabolic Panel:    Component Value Date/Time   NA 138 05/10/2017 0419   NA 138 05/09/2017 0436   K 3.8 05/10/2017 0419   K 3.8 05/09/2017 0436   CL 105 05/10/2017 0419   CL 107 05/09/2017 0436   CO2 25 05/10/2017 0419   CO2 24 05/09/2017 0436   BUN <5 (L) 05/10/2017 0419   BUN 5 (L) 05/09/2017 0436   CREATININE 0.46 05/10/2017 0419   CREATININE 0.42 (L) 05/09/2017 0436   GLUCOSE 111 (H) 05/10/2017 0419   GLUCOSE 91 05/09/2017 0436   CALCIUM 9.3 05/10/2017 0419   CALCIUM 9.0 05/09/2017 0436   AST 17 05/08/2017 0355   AST 16 05/07/2017 1557   ALT 16 05/08/2017 0355   ALT 19 05/07/2017 1557   ALKPHOS 64 05/08/2017 0355   ALKPHOS 72 05/07/2017 1557   BILITOT 0.6 05/08/2017 0355   BILITOT 0.8 05/07/2017 1557   PROT 6.9 05/08/2017 0355   PROT 8.0 05/07/2017 1557   ALBUMIN 3.5 05/08/2017 0355   ALBUMIN 4.1 05/07/2017 1557     Studies/Results: No results found.  Anti-infectives: Anti-infectives    None       [place] Churchill J 05/10/2017

## 2017-05-11 ENCOUNTER — Encounter (HOSPITAL_COMMUNITY): Payer: Self-pay | Admitting: Internal Medicine

## 2017-05-11 LAB — GLUCOSE, CAPILLARY
GLUCOSE-CAPILLARY: 115 mg/dL — AB (ref 65–99)
GLUCOSE-CAPILLARY: 97 mg/dL (ref 65–99)
Glucose-Capillary: 99 mg/dL (ref 65–99)
Glucose-Capillary: 111 mg/dL — ABNORMAL HIGH (ref 65–99)

## 2017-05-11 LAB — BASIC METABOLIC PANEL
Anion gap: 10 (ref 5–15)
CHLORIDE: 104 mmol/L (ref 101–111)
CO2: 25 mmol/L (ref 22–32)
Calcium: 9.1 mg/dL (ref 8.9–10.3)
Creatinine, Ser: 0.46 mg/dL (ref 0.44–1.00)
GFR calc Af Amer: >60 mL/min (ref >60)
GFR calc non Af Amer: >60 mL/min (ref >60)
GLUCOSE: 119 mg/dL — AB (ref 65–99)
Potassium: 3.6 mmol/L (ref 3.5–5.1)
Sodium: 139 mmol/L (ref 135–145)

## 2017-05-11 LAB — LIPID PANEL
CHOL/HDL RATIO: 8.4 ratio
CHOLESTEROL: 253 mg/dL — AB (ref 0–200)
HDL: 30 mg/dL — AB (ref >40)
LDL Cholesterol: UNDETERMINED mg/dL (ref 0–99)
Triglycerides: 481 mg/dL — ABNORMAL HIGH (ref <150)
VLDL: UNDETERMINED mg/dL (ref 0–40)

## 2017-05-11 MED ORDER — POTASSIUM CHLORIDE 10 MEQ/100ML IV SOLN
10.0000 meq | INTRAVENOUS | Status: AC
  Administered 2017-05-11 (×3): 10 meq via INTRAVENOUS
  Filled 2017-05-11 (×4): qty 100

## 2017-05-11 MED ORDER — HYDROMORPHONE HCL 1 MG/ML IJ SOLN
1.0000 mg | INTRAMUSCULAR | Status: DC | PRN
  Administered 2017-05-11 – 2017-05-12 (×4): 1 mg via INTRAVENOUS
  Filled 2017-05-11 (×4): qty 1

## 2017-05-11 NOTE — Progress Notes (Signed)
  General Surgery [facility]  Assessment & Plan: Abdominal pain - suspect pancreatitis secondary to hypertriglyceridemia  CT reviewed  EGD results noted - no gastric pathology  CEA and CA19-9 normal  Persistent epigastric and LUQ pain  NPO - consider TNA Hypertriglyceridemia History of pancreatitis with prolonged hospitalization in [place]  No evidence of gastric tumor or malignancy.  Favor acute on chronic pancreatitis due to elevated triglycerides.  Would continue NPO for now and monitor abdominal pain.  May need repeat scan in a few days.  Will follow.  No role for operative intervention at present.         Velora Heckler, MD, [facility]       Office: [phone]    Chief Complaint: Abdominal pain in epigastrium, LUQ  Subjective: Patient in bed, pleasant.  Complains of abdominal pain.  Objective: Vital signs in last 24 hours: Temp:  [98.3 F (36.8 C)-98.4 F (36.9 C)] 98.4 F (36.9 C) (05/14 0421) Pulse Rate:  [76-92] 92 (05/14 0421) Resp:  [16-18] 18 (05/14 0421) BP: (137-146)/(92-98) 143/98 (05/14 0421) SpO2:  [96 %-98 %] 98 % (05/14 0421) Last BM Date: 05/08/17  Intake/Output from previous day: 05/13 0701 - 05/14 0700 In: 1270 [I.V.:1220; IV Piggyback:50] Out: -  Intake/Output this shift: No intake/output data recorded.  Physical Exam: HEENT - sclerae clear, mucous membranes moist Neck - soft Chest - clear bilaterally Cor - RRR Abdomen - soft without distension; tenderness to palpation max in LUQ, moderate in epigastrium, mild in RUQ; no mass Ext - no edema, non-tender Neuro - alert & oriented, no focal deficits  Lab Results:   Recent Labs  05/09/17 0436 05/10/17 0419  WBC 6.9 8.0  HGB 10.3* 10.2*  HCT 31.5* 30.7*  PLT 292 295   BMET  Recent Labs  05/10/17 0419 05/11/17 0352  NA 138 139  K 3.8 3.6  CL 105 104  CO2 25 25  GLUCOSE 111* 119*  BUN <5* <5*  CREATININE 0.46 0.46   CALCIUM 9.3 9.1   PT/INR No results for input(s): LABPROT, INR in the last 72 hours. Comprehensive Metabolic Panel:    Component Value Date/Time   NA 139 05/11/2017 0352   NA 138 05/10/2017 0419   K 3.6 05/11/2017 0352   K 3.8 05/10/2017 0419   CL 104 05/11/2017 0352   CL 105 05/10/2017 0419   CO2 25 05/11/2017 0352   CO2 25 05/10/2017 0419   BUN <5 (L) 05/11/2017 0352   BUN <5 (L) 05/10/2017 0419   CREATININE 0.46 05/11/2017 0352   CREATININE 0.46 05/10/2017 0419   GLUCOSE 119 (H) 05/11/2017 0352   GLUCOSE 111 (H) 05/10/2017 0419   CALCIUM 9.1 05/11/2017 0352   CALCIUM 9.3 05/10/2017 0419   AST 17 05/08/2017 0355   AST 16 05/07/2017 1557   ALT 16 05/08/2017 0355   ALT 19 05/07/2017 1557   ALKPHOS 64 05/08/2017 0355   ALKPHOS 72 05/07/2017 1557   BILITOT 0.6 05/08/2017 0355   BILITOT 0.8 05/07/2017 1557   PROT 6.9 05/08/2017 0355   PROT 8.0 05/07/2017 1557   ALBUMIN 3.5 05/08/2017 0355   ALBUMIN 4.1 05/07/2017 1557    Studies/Results: No results found.    Ivory Maduro M 05/11/2017  Patient ID: Joyce Cline, female   DOB: Mar 17, 1971, 46 y.o.   MRN: [id number]

## 2017-05-11 NOTE — Progress Notes (Signed)
PROGRESS NOTE    Joyce Cline  JSE:[id number] DOB: 11-29-1971 DOA: 05/07/2017 PCP: Patient, No Pcp Per    Brief Narrative:  46 yo female presents with the chief complain of abdominal pain, worsening symptoms for the last 4 days, left upper quadrant, radiated to the back and associated with nausea. Patient know to have chronic pancreatitis due to hypertriglyceridemia, requiring plasmapheresis. On the initial examination, patient hemodynamically stable, blood pressure 145/87 with heart rate 96 and respiratory rate 18. Heart S1-S2 present, lungs were clear to auscultation, her abdomen was soft, non tender and non distended. Abdominal CT with gastric mass. Patient admitted for pain control and further workup. Gastroenterology was consulted. EGD with no gastric mass, surgery consulted. Recommendations for conservative management.    Assessment & Plan:   Active Problems:   Abdominal pain   Hypertriglyceridemia   NICM (nonischemic cardiomyopathy) (HCC)   History of pancreatitis   Abnormal CT of the abdomen   Anemia, chronic disease   Gastric mass   1. Abdominal mass. Stable suspected to be related to acute on chronic pancreatitis, follow surgery recommendations. Will request records from primary care provider in [facility].  2. Acute on chronic pancreatitis due to hypertriglyceridemia.Continue NPO, supportive medical therapy with IV fluids, analgesics and atiemetics. Will increase hydromorphone to 1 mg as need q 4 hours. Triglycerides in the 481.   3. Non ischemic cardiomyopathy.  No signs of volume overload, follow on outpatient records. Continue hydration with IV fluids. B blocker with coreg.   4. 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 Follow on cell count in am, blood transfusion for hb less than 7.   5. HTN. Continue coreg and as needed hydralazine. Blood pressure systolic stable at 140  6. Dyslipidemia. Follow on outpatient records, apparently patient did not  tolerate fenofibrate or gemfibrozil. Suspected rhabdomyolysis. Niacin can be an option when patient tolerates po.   7. Hypokalemia. K at 3,6, will order IV kcl 40 meq, target k at 4. Follow on MG in am.   DVT prophylaxis: scd Code Status:Full  Family Communication: Disposition Plan:  Consultants:  Gastroenterology   Surgery   Procedures:    Antimicrobials:     Subjective: Patient with no chest pain or dyspnea, persistent abdominal pain at the left upper quadrant and epigastric area, radiated to the back, no associated nausea or vomiting, improved with pain medications, worse to touch.   Objective: Vitals:   05/10/17 0457 05/10/17 1425 05/10/17 2059 05/11/17 0421  BP: 140/82 (!) 146/95 (!) 137/92 (!) 143/98  Pulse: 86 76 77 92  Resp: 18 16 18 18   Temp: 98.5 F (36.9 C) 98.4 F (36.9 C) 98.3 F (36.8 C) 98.4 F (36.9 C)  TempSrc: Oral Oral Oral Oral  SpO2: 98% 96% 97% 98%  Weight:      Height:        Intake/Output Summary (Last 24 hours) at 05/11/17 1231 Last data filed at 05/11/17 0615  Gross per 24 hour  Intake             1270 ml  Output                0 ml  Net             1270 ml   Filed Weights   05/07/17 1551 05/07/17 2250 05/08/17 1051  Weight: 74.8 kg (165 lb) 74.8 kg (165 lb) 74.8 kg (165 lb)    Examination:  General exam: deconditioned E ENT: no pallor or icterus, oral  mucosa moist.  Respiratory system: Clear to auscultation. Respiratory effort normal. No wheezing, rales or rhonchi.  Cardiovascular system: S1 & S2 heard, RRR. No JVD, murmurs, rubs, gallops or clicks. No pedal edema. Gastrointestinal system: Abdomen is mildly distended, tender to superficial palpation with no organomegaly or masses felt. Decreased bowel sounds no rebound.  Central nervous system: Alert and oriented. No focal neurological deficits. Extremities: Symmetric 5 x 5 power. Skin: No rashes, lesions or ulcers   Data Reviewed: I have personally reviewed  following labs and imaging studies  CBC:  Recent Labs Lab 05/07/17 1557 05/08/17 0355 05/09/17 0436 05/10/17 0419  WBC 9.8 7.2 6.9 8.0  NEUTROABS  --   --  3.9 4.7  HGB 11.2* 10.4* 10.3* 10.2*  HCT 33.0* 31.7* 31.5* 30.7*  MCV 97.3 98.4 99.1 97.5  PLT 316 280 292 295   Basic Metabolic Panel:  Recent Labs Lab 05/07/17 1557 05/08/17 0355 05/09/17 0436 05/10/17 0419 05/11/17 0352  NA 138 135 138 138 139  K 3.9 3.6 3.8 3.8 3.6  CL 103 104 107 105 104  CO2 22 23 24 25 25   GLUCOSE 96 123* 91 111* 119*  BUN 10 5* 5* <5* <5*  CREATININE 0.50 0.47 0.42* 0.46 0.46  CALCIUM 10.0 8.7* 9.0 9.3 9.1  MG  --  2.0  --   --   --    GFR: Estimated Creatinine Clearance: 82.3 mL/min (by C-G formula based on SCr of 0.46 mg/dL). Liver Function Tests:  Recent Labs Lab 05/07/17 1557 05/08/17 0355  AST 16 17  ALT 19 16  ALKPHOS 72 64  BILITOT 0.8 0.6  PROT 8.0 6.9  ALBUMIN 4.1 3.5    Recent Labs Lab 05/07/17 1557  LIPASE 27   No results for input(s): AMMONIA in the last 168 hours. Coagulation Profile: No results for input(s): INR, PROTIME in the last 168 hours. Cardiac Enzymes: No results for input(s): CKTOTAL, CKMB, CKMBINDEX, TROPONINI in the last 168 hours. BNP (last 3 results) No results for input(s): PROBNP in the last 8760 hours. HbA1C: No results for input(s): HGBA1C in the last 72 hours. CBG:  Recent Labs Lab 05/10/17 0437 05/10/17 1208 05/10/17 1733 05/10/17 2303 05/11/17 0504  GLUCAP 101* 99 106* 110* 115*   Lipid Profile:  Recent Labs  05/09/17 0436 05/11/17 0352  CHOL  --  253*  HDL  --  30*  LDLCALC  --  UNABLE TO CALCULATE IF TRIGLYCERIDE OVER 400 mg/dL  TRIG 161* 096*  CHOLHDL  --  8.4   Thyroid Function Tests: No results for input(s): TSH, T4TOTAL, FREET4, T3FREE, THYROIDAB in the last 72 hours. Anemia Panel: No results for input(s): VITAMINB12, FOLATE, FERRITIN, TIBC, IRON, RETICCTPCT in the last 72 hours. Sepsis Labs: No results  for input(s): PROCALCITON, LATICACIDVEN in the last 168 hours.  No results found for this or any previous visit (from the past 240 hour(s)).       Radiology Studies: No results found.      Scheduled Meds: . carvedilol  25 mg Oral BID  . feeding supplement  1 Container Oral TID BM  . folic acid  1 mg Oral Daily  . lipase/protease/amylase  12,000 Units Oral TID WC  . magnesium oxide  400 mg Oral QHS  . multivitamin with minerals  1 tablet Oral Daily  . omega-3 acid ethyl esters  1 g Oral Daily  . thiamine  100 mg Oral Daily   Or  . thiamine  100 mg Intravenous Daily  Continuous Infusions: . dextrose 5 % and 0.9% NaCl 75 mL/hr at 05/11/17 0355  . famotidine (PEPCID) IV Stopped (05/11/17 0845)     LOS: 3 days     Joyce Ganim Annett Gula, MD [facility] Pager [phone]  If 7PM-7AM, please contact night-coverage [email or web address] Password [facility] 05/11/2017, 12:31 PM

## 2017-05-12 LAB — MAGNESIUM: Magnesium: 1.9 mg/dL (ref 1.7–2.4)

## 2017-05-12 LAB — GLUCOSE, CAPILLARY
GLUCOSE-CAPILLARY: 115 mg/dL — AB (ref 65–99)
GLUCOSE-CAPILLARY: 86 mg/dL (ref 65–99)
Glucose-Capillary: 98 mg/dL (ref 65–99)

## 2017-05-12 LAB — CBC WITH DIFFERENTIAL/PLATELET
BASOS PCT: 0 %
Basophils Absolute: 0 10*3/uL (ref 0.0–0.1)
EOS ABS: 0.2 10*3/uL (ref 0.0–0.7)
Eosinophils Relative: 3 %
HCT: 31.4 % — ABNORMAL LOW (ref 36.0–46.0)
Hemoglobin: 10.6 g/dL — ABNORMAL LOW (ref 12.0–15.0)
LYMPHS ABS: 2.4 10*3/uL (ref 0.7–4.0)
Lymphocytes Relative: 37 %
MCH: 32.5 pg (ref 26.0–34.0)
MCHC: 33.8 g/dL (ref 30.0–36.0)
MCV: 96.3 fL (ref 78.0–100.0)
MONO ABS: 0.5 10*3/uL (ref 0.1–1.0)
MONOS PCT: 8 %
Neutro Abs: 3.3 10*3/uL (ref 1.7–7.7)
Neutrophils Relative %: 52 %
Platelets: 375 10*3/uL (ref 150–400)
RBC: 3.26 MIL/uL — ABNORMAL LOW (ref 3.87–5.11)
RDW: 12.7 % (ref 11.5–15.5)
WBC: 6.3 10*3/uL (ref 4.0–10.5)

## 2017-05-12 LAB — BASIC METABOLIC PANEL
Anion gap: 10 (ref 5–15)
CALCIUM: 9.1 mg/dL (ref 8.9–10.3)
CO2: 23 mmol/L (ref 22–32)
CREATININE: 0.43 mg/dL — AB (ref 0.44–1.00)
Chloride: 104 mmol/L (ref 101–111)
GFR calc non Af Amer: >60 mL/min (ref >60)
Glucose, Bld: 116 mg/dL — ABNORMAL HIGH (ref 65–99)
Potassium: 3.6 mmol/L (ref 3.5–5.1)
SODIUM: 137 mmol/L (ref 135–145)

## 2017-05-12 MED ORDER — POTASSIUM CHLORIDE 20 MEQ PO PACK: 40.0000 meq | PACK | Freq: Once | ORAL | Status: DC

## 2017-05-12 MED ORDER — POTASSIUM CHLORIDE CRYS ER 20 MEQ PO TBCR
40.0000 meq | EXTENDED_RELEASE_TABLET | Freq: Once | ORAL | Status: AC
  Administered 2017-05-12: 40 meq via ORAL
  Filled 2017-05-12: qty 2

## 2017-05-12 MED ORDER — MAGNESIUM OXIDE 400 (241.3 MG) MG PO TABS
400.0000 mg | ORAL_TABLET | Freq: Two times a day (BID) | ORAL | Status: DC
  Administered 2017-05-12 – 2017-05-14 (×5): 400 mg via ORAL
  Filled 2017-05-12 (×5): qty 1

## 2017-05-12 MED ORDER — NIACIN 100 MG PO TABS
200.0000 mg | ORAL_TABLET | Freq: Every day | ORAL | Status: DC
  Administered 2017-05-12 – 2017-05-13 (×2): 200 mg via ORAL
  Filled 2017-05-12 (×3): qty 2

## 2017-05-12 MED ORDER — MORPHINE SULFATE (PF) 4 MG/ML IV SOLN
2.0000 mg | INTRAVENOUS | Status: DC | PRN
  Administered 2017-05-12 – 2017-05-14 (×10): 2 mg via INTRAVENOUS
  Filled 2017-05-12 (×11): qty 1

## 2017-05-12 NOTE — Progress Notes (Signed)
4 Days Post-Op    CC:  Abdominal pain.  Subjective: She still has abdominal pain in the left upper quadrant. This is different than her normal mid upper epigastric pain. She also reports she is hungry which she has not been in the past with pancreatitis. She is aware that if she has recurrent pancreatitis she should not be taking PO's. She would prefer to be at home and handling his pain at home if possible. She was started on clear liquids this a.m. by medicine. Dr. Ella Jubilee Objective: Vital signs in last 24 hours: Temp:  [98 F (36.7 C)-98.7 F (37.1 C)] 98.4 F (36.9 C) (05/15 0450) Pulse Rate:  [68-78] 76 (05/15 0450) Resp:  [18] 18 (05/15 0450) BP: (140-149)/(83-97) 149/95 (05/15 0450) SpO2:  [96 %-98 %] 98 % (05/15 0450) Last BM Date: 05/08/17 Nothing by mouth so far 1856 IV Voided 3 recorded No BM recorded Afebrile diastolic blood pressure somewhat elevated. BMP is stable Total cholesterol 8.4, cholesterol 253, HDL 30, triglycerides down to 481 CBC is stable and normal. CEA and CA 19-9 are normal HIV is nonreactive CT 05/07/17:  7.6 x 7.1 x 4.4 cm mass posterior body of the stomach with poor distinction of the gastric wall. Mild surrounding soft tissue inflammation. Liver was unremarkable, patient's status post cholecystectomy with clips the common bile duct is normal caliber. Pancreas was grossly unremarkable in appearance vague attenuating masslike density in the anterior to distal pancreatic body.    Intake/Output from previous day: 05/14 0701 - 05/15 0700 In: 1856.3 [I.V.:1706.3; IV Piggyback:150] Out: -  Intake/Output this shift: Total I/O In: 50 [IV Piggyback:50] Out: -   General appearance: alert, cooperative and no distress Resp: clear to auscultation bilaterally GI: Soft, tolerating clears. Pain is in the left upper quadrant, almost in her side. This area is tender to palpation. Currently no nausea or vomiting with by mouth intake.  Lab Results:   Recent  Labs  05/10/17 0419 05/12/17 0347  WBC 8.0 6.3  HGB 10.2* 10.6*  HCT 30.7* 31.4*  PLT 295 375    BMET  Recent Labs  05/11/17 0352 05/12/17 0347  NA 139 137  K 3.6 3.6  CL 104 104  CO2 25 23  GLUCOSE 119* 116*  BUN <5* <5*  CREATININE 0.46 0.43*  CALCIUM 9.1 9.1   PT/INR No results for input(s): LABPROT, INR in the last 72 hours.   Recent Labs Lab 05/07/17 1557 05/08/17 0355  AST 16 17  ALT 19 16  ALKPHOS 72 64  BILITOT 0.8 0.6  PROT 8.0 6.9  ALBUMIN 4.1 3.5     Lipase     Component Value Date/Time   LIPASE 27 05/07/2017 1557     Medications: . carvedilol  25 mg Oral BID  . feeding supplement  1 Container Oral TID BM  . folic acid  1 mg Oral Daily  . lipase/protease/amylase  12,000 Units Oral TID WC  . magnesium oxide  400 mg Oral BID  . multivitamin with minerals  1 tablet Oral Daily  . niacin  200 mg Oral QHS  . omega-3 acid ethyl esters  1 g Oral Daily  . thiamine  100 mg Oral Daily   Or  . thiamine  100 mg Intravenous Daily   . dextrose 5 % and 0.9% NaCl 50 mL/hr at 05/12/17 1213  . famotidine (PEPCID) IV Stopped (05/12/17 1013)   Assessment/Plan Left lower quadrant Abdominal pain with hx of pancreatitis EGD 05/08/17 Dr. Marina Goodell - normal  Inflammatory mass lesser sac. Chronic pancreatic insufficiency History of hypertriglyceridemia requiring plasmapheresis History CHF/nonischemic cardiomyopathy History of transfusion for spontaneous splenic bleed. Hypertension FEN: IV fluids/clear diet started 5/15 ID: None DVT: SCDs only  Plan: She continues to have pain left upper quadrant. She is on clears currently with no additional discomfort. We will discuss repeat imaging with Dr. Gerrit Friends. Patient was on Pepcid and this was discontinued. CEA and CA 19-9 are normal.      LOS: 4 days    Ellwyn Ergle 05/12/2017 [phone]

## 2017-05-12 NOTE — Progress Notes (Signed)
PROGRESS NOTE    Joyce Cline  WJX:[id number] DOB: [birth year] DOA: 05/07/2017 PCP: Patient, No Pcp Per    Brief Narrative:  46 yo female presents with the chief complain of abdominal pain, worsening symptoms for the last 4 days, left upper quadrant, radiated to the back and associated with nausea. Patient know to have chronic pancreatitis due to hypertriglyceridemia, requiring plasmapheresis. On the initial examination, patient hemodynamically stable, blood pressure 145/87 with heart rate 96 and respiratory rate 18. Heart S1-S2 present, lungs were clear to auscultation, her abdomen was soft, non tender and non distended. Abdominal CT with gastric mass. Patient admitted for pain control and further workup. Gastroenterology was consulted. EGD with no gastricmass, surgery consulted. Recommendations for conservative management.    Assessment & Plan:   Active Problems:   Abdominal pain   Hypertriglyceridemia   NICM (nonischemic cardiomyopathy) (HCC)   History of pancreatitis   Abnormal CT of the abdomen   Anemia, chronic disease   Gastric mass   1. Abdominal mass. CT changes suspected to be related to acute on chronic pancreatitis, conservative management per surgery recommendations, may need follow up imaging inpatient or outpatient.  2. Acute on chronic pancreatitis due to hypertriglyceridemia. Patient with increase appetite, pain better controlled with IV morphine, will advance diet to clears, continue supportive medical therapy with IV fluids at a lower rate 50 ml per hour, IV anti-acids and atiemetics. Elevated triglycerides, in the past have been in the 1000s, will start patient on niacin.    3. Non ischemic cardiomyopathy. Will decrease rate of IV fluids to prevent volume overload, pending outpatient records. Continue B blocker with coreg, with good toleration.    4. 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 stable Hb and hct, no sign of bleeding, continue  close monitoring of cell count.  5. HTN. Oncoreg and as needed IV hydralazine. Blood pressure systolic stable at 140 to 150's.   6. Dyslipidemia. Apparently patient did not tolerate fenofibrate or gemfibrozil due to suspected rhabdomyolysis. Will start patient on niacin for now and follow up response.  7. Hypokalemia. K at 3,6 with serum cr at 0.43 and Mg at 1,9, will continue correction of electrolytes with kcl po and mg oxide po, follow renal panel in am, diet has been advanced to clears.    DVT prophylaxis: scd Code Status:Full  Family Communication: Disposition Plan:  Consultants:  Gastroenterology   Surgery   Procedures:    Antimicrobials:    Subjective: Improved abdominal pain, better response to morphine than hydromorphone, no chest pain or dyspnea. Improved appetite. No fever or chills.   Objective: Vitals:   05/11/17 0421 05/11/17 1435 05/11/17 2115 05/12/17 0450  BP: (!) 143/98 (!) 140/97 (!) 141/83 (!) 149/95  Pulse: 92 68 78 76  Resp: 18 18 18 18   Temp: 98.4 F (36.9 C) 98 F (36.7 C) 98.7 F (37.1 C) 98.4 F (36.9 C)  TempSrc: Oral Oral Oral Oral  SpO2: 98% 96% 97% 98%  Weight:      Height:        Intake/Output Summary (Last 24 hours) at 05/12/17 1043 Last data filed at 05/12/17 0943  Gross per 24 hour  Intake          1906.25 ml  Output                0 ml  Net          1906.25 ml   Filed Weights   05/07/17 1551 05/07/17 2250 05/08/17 1051  Weight: 74.8 kg (165 lb) 74.8 kg (165 lb) 74.8 kg (165 lb)    Examination:  General exam: deconditioned E ENT: mild pallor, no icterus, oral mucosa moist.  Respiratory system: Clear to auscultation. Respiratory effort normal. No wheezing, rales or rhonchi.  Cardiovascular system: S1 & S2 heard, RRR. No JVD, murmurs, rubs, gallops or clicks. No pedal edema. Gastrointestinal system: Abdomen is mildly distended, soft. Tender to deep palpation at the epigastric region, no rebound or peritoneal  signs.  No organomegaly or masses felt. Normal bowel sounds heard. Central nervous system: Alert and oriented. No focal neurological deficits. Extremities: Symmetric 5 x 5 power. Skin: No rashes, lesions or ulcers     Data Reviewed: I have personally reviewed following labs and imaging studies  CBC:  Recent Labs Lab 05/07/17 1557 05/08/17 0355 05/09/17 0436 05/10/17 0419 05/12/17 0347  WBC 9.8 7.2 6.9 8.0 6.3  NEUTROABS  --   --  3.9 4.7 3.3  HGB 11.2* 10.4* 10.3* 10.2* 10.6*  HCT 33.0* 31.7* 31.5* 30.7* 31.4*  MCV 97.3 98.4 99.1 97.5 96.3  PLT 316 280 292 295 375   Basic Metabolic Panel:  Recent Labs Lab 05/08/17 0355 05/09/17 0436 05/10/17 0419 05/11/17 0352 05/12/17 0347  NA 135 138 138 139 137  K 3.6 3.8 3.8 3.6 3.6  CL 104 107 105 104 104  CO2 23 24 25 25 23   GLUCOSE 123* 91 111* 119* 116*  BUN 5* 5* <5* <5* <5*  CREATININE 0.47 0.42* 0.46 0.46 0.43*  CALCIUM 8.7* 9.0 9.3 9.1 9.1  MG 2.0  --   --   --  1.9   GFR: Estimated Creatinine Clearance: 82.3 mL/min (A) (by C-G formula based on SCr of 0.43 mg/dL (L)). Liver Function Tests:  Recent Labs Lab 05/07/17 1557 05/08/17 0355  AST 16 17  ALT 19 16  ALKPHOS 72 64  BILITOT 0.8 0.6  PROT 8.0 6.9  ALBUMIN 4.1 3.5    Recent Labs Lab 05/07/17 1557  LIPASE 27   No results for input(s): AMMONIA in the last 168 hours. Coagulation Profile: No results for input(s): INR, PROTIME in the last 168 hours. Cardiac Enzymes: No results for input(s): CKTOTAL, CKMB, CKMBINDEX, TROPONINI in the last 168 hours. BNP (last 3 results) No results for input(s): PROBNP in the last 8760 hours. HbA1C: No results for input(s): HGBA1C in the last 72 hours. CBG:  Recent Labs Lab 05/11/17 0504 05/11/17 1252 05/11/17 1813 05/11/17 2153 05/12/17 0558  GLUCAP 115* 97 99 111* 115*   Lipid Profile:  Recent Labs  05/11/17 0352  CHOL 253*  HDL 30*  LDLCALC UNABLE TO CALCULATE IF TRIGLYCERIDE OVER 400 mg/dL  TRIG  628*  CHOLHDL 8.4   Thyroid Function Tests: No results for input(s): TSH, T4TOTAL, FREET4, T3FREE, THYROIDAB in the last 72 hours. Anemia Panel: No results for input(s): VITAMINB12, FOLATE, FERRITIN, TIBC, IRON, RETICCTPCT in the last 72 hours. Sepsis Labs: No results for input(s): PROCALCITON, LATICACIDVEN in the last 168 hours.  No results found for this or any previous visit (from the past 240 hour(s)).       Radiology Studies: No results found.      Scheduled Meds: . carvedilol  25 mg Oral BID  . feeding supplement  1 Container Oral TID BM  . folic acid  1 mg Oral Daily  . lipase/protease/amylase  12,000 Units Oral TID WC  . magnesium oxide  400 mg Oral QHS  . multivitamin with minerals  1 tablet Oral  Daily  . omega-3 acid ethyl esters  1 g Oral Daily  . thiamine  100 mg Oral Daily   Or  . thiamine  100 mg Intravenous Daily   Continuous Infusions: . dextrose 5 % and 0.9% NaCl 75 mL/hr at 05/11/17 2026  . famotidine (PEPCID) IV 20 mg (05/12/17 0943)     LOS: 4 days     Mauricio Annett Gula, MD [facility] Pager [phone]  If 7PM-7AM, please contact night-coverage [email or web address] Password [facility] 05/12/2017, 10:43 AM

## 2017-05-13 DIAGNOSIS — Z8719 Personal history of other diseases of the digestive system: Secondary | ICD-10-CM

## 2017-05-13 DIAGNOSIS — R1012 Left upper quadrant pain: Secondary | ICD-10-CM

## 2017-05-13 DIAGNOSIS — E781 Pure hyperglyceridemia: Secondary | ICD-10-CM

## 2017-05-13 DIAGNOSIS — I428 Other cardiomyopathies: Secondary | ICD-10-CM

## 2017-05-13 DIAGNOSIS — D638 Anemia in other chronic diseases classified elsewhere: Secondary | ICD-10-CM

## 2017-05-13 DIAGNOSIS — K319 Disease of stomach and duodenum, unspecified: Secondary | ICD-10-CM

## 2017-05-13 LAB — CBC WITH DIFFERENTIAL/PLATELET
BASOS PCT: 0 %
Basophils Absolute: 0 10*3/uL (ref 0.0–0.1)
Eosinophils Absolute: 0.1 10*3/uL (ref 0.0–0.7)
Eosinophils Relative: 1 %
HEMATOCRIT: 34 % — AB (ref 36.0–46.0)
Hemoglobin: 11.5 g/dL — ABNORMAL LOW (ref 12.0–15.0)
LYMPHS ABS: 2.7 10*3/uL (ref 0.7–4.0)
Lymphocytes Relative: 29 %
MCH: 32.5 pg (ref 26.0–34.0)
MCHC: 33.8 g/dL (ref 30.0–36.0)
MCV: 96 fL (ref 78.0–100.0)
MONO ABS: 0.8 10*3/uL (ref 0.1–1.0)
MONOS PCT: 8 %
NEUTROS ABS: 5.8 10*3/uL (ref 1.7–7.7)
Neutrophils Relative %: 62 %
Platelets: 407 10*3/uL — ABNORMAL HIGH (ref 150–400)
RBC: 3.54 MIL/uL — ABNORMAL LOW (ref 3.87–5.11)
RDW: 12.5 % (ref 11.5–15.5)
WBC: 9.4 10*3/uL (ref 4.0–10.5)

## 2017-05-13 LAB — GLUCOSE, CAPILLARY
GLUCOSE-CAPILLARY: 125 mg/dL — AB (ref 65–99)
Glucose-Capillary: 85 mg/dL (ref 65–99)
Glucose-Capillary: 96 mg/dL (ref 65–99)
Glucose-Capillary: 97 mg/dL (ref 65–99)

## 2017-05-13 LAB — BASIC METABOLIC PANEL
ANION GAP: 7 (ref 5–15)
BUN: 6 mg/dL (ref 6–20)
CALCIUM: 9.4 mg/dL (ref 8.9–10.3)
CO2: 25 mmol/L (ref 22–32)
CREATININE: 0.48 mg/dL (ref 0.44–1.00)
Chloride: 107 mmol/L (ref 101–111)
GFR calc Af Amer: >60 mL/min (ref >60)
GFR calc non Af Amer: >60 mL/min (ref >60)
GLUCOSE: 110 mg/dL — AB (ref 65–99)
Potassium: 3.9 mmol/L (ref 3.5–5.1)
Sodium: 139 mmol/L (ref 135–145)

## 2017-05-13 NOTE — Progress Notes (Signed)
PROGRESS NOTE    Joyce Cline  ZOX:[id number] DOB: 1971-07-26 DOA: 05/07/2017 PCP: Patient, No Pcp Per   Chief Complaint  Patient presents with  . Abdominal Pain    Brief Narrative:  HPI on 05/07/2017 by Dr. Delrae Sawyers Joyce Cline is a 46 y.o. female with history of chronic pancreatitis secondary to hypertriglyceridemia previously requiring plasmapheresis who had just moved from [place] to [place] 6 months ago presents to the ER with complaints of abdominal pain. Patient has been having left upper quadrant pain stabbing in nature radiating to the back. Last 4 days. Has been having nausea denies vomiting has been having some loose stools. Denies any fever or chills or any recent sick contacts or use of antibiotics.   Assessment & Plan   Abdominal pain with possible mass -CT change is suspected to be related to patient's acute on chronic pancreatitis -Gen. surgery consulted and appreciated, pending further recommendations and possibly more imaging -Gastroenterology consulted and appreciated, status post EGD which was normal  Acute on chronic pancreatitis secondary to hypertriglyceridemia -Continue IV fluids, conservative management with pain control, antiemetics -Currently on clear liquid diet -Continue Creon  History of nonischemic cardiomyopathy -Continue Coreg  Essential hypertension -Continue Coreg  Normocytic anemia -Anemia panel showed ferritin of 303, iron 43, TIBC 337, transferrin saturation 13 -Continue iron supplementation -Hemoglobin currently stable, 1.5  Dyslipidemia -Patient unable to tolerate enough Ebright or gemfibrozil due to rhabdomyolysis -Continue niacin, Lovaza -Lipid panel showed total cholesterol 253, HDL 30, LDL unable to calculate, triglycerides 41 (per patient TG >1000 in the past)  Hypokalemia -currently stable, K3.9 -Continue to monitor BMP  DVT Prophylaxis  SCDs  Code Status: Full  Family Communication: None  at bedside  Disposition Plan: Admitted, pending further recommendations from surgery  Consultants General surgery Gastroenterology  Procedures  EGD  Antibiotics   Anti-infectives    None      Subjective:   Joyce Cline seen and examined today.  Patient continues to have some left-sided abdominal pain along with some epigastric pain. Has not been able to drink very much. Denies any nausea or vomiting. Denies current chest pain, shortness of breath, diarrhea or constipation.  Objective:   Vitals:   05/12/17 0450 05/12/17 1255 05/12/17 2223 05/13/17 0552  BP: (!) 149/95 (!) 131/91 (!) 149/96 (!) 146/97  Pulse: 76 72 70   Resp: 18 16 16 16   Temp: 98.4 F (36.9 C) 98.3 F (36.8 C) 98.6 F (37 C) 98.2 F (36.8 C)  TempSrc: Oral Oral Oral Oral  SpO2: 98% 98% 98% 98%  Weight:      Height:        Intake/Output Summary (Last 24 hours) at 05/13/17 1353 Last data filed at 05/13/17 1000  Gross per 24 hour  Intake              610 ml  Output                1 ml  Net              609 ml   Filed Weights   05/07/17 1551 05/07/17 2250 05/08/17 1051  Weight: 74.8 kg (165 lb) 74.8 kg (165 lb) 74.8 kg (165 lb)    Exam  General: Well developed, well nourished, NAD, appears stated age  HEENT: NCAT, mucous membranes moist.   Cardiovascular: S1 S2 auscultated, no rubs, murmurs or gallops. Regular rate and rhythm.  Respiratory: Clear to auscultation bilaterally with equal chest rise  Abdomen: Soft,TTP LUQ/side  and epigastric areas, nondistended, + bowel sounds  Extremities: warm dry without cyanosis clubbing or edema  Neuro: AAOx3, nonfocal  Psych: Normal affect and demeanor with intact judgement and insight   Data Reviewed: I have personally reviewed following labs and imaging studies  CBC:  Recent Labs Lab 05/08/17 0355 05/09/17 0436 05/10/17 0419 05/12/17 0347 05/13/17 0337  WBC 7.2 6.9 8.0 6.3 9.4  NEUTROABS  --  3.9 4.7 3.3 5.8  HGB 10.4* 10.3*  10.2* 10.6* 11.5*  HCT 31.7* 31.5* 30.7* 31.4* 34.0*  MCV 98.4 99.1 97.5 96.3 96.0  PLT 280 292 295 375 407*   Basic Metabolic Panel:  Recent Labs Lab 05/08/17 0355 05/09/17 0436 05/10/17 0419 05/11/17 0352 05/12/17 0347 05/13/17 0337  NA 135 138 138 139 137 139  K 3.6 3.8 3.8 3.6 3.6 3.9  CL 104 107 105 104 104 107  CO2 23 24 25 25 23 25   GLUCOSE 123* 91 111* 119* 116* 110*  BUN 5* 5* <5* <5* <5* 6  CREATININE 0.47 0.42* 0.46 0.46 0.43* 0.48  CALCIUM 8.7* 9.0 9.3 9.1 9.1 9.4  MG 2.0  --   --   --  1.9  --    GFR: Estimated Creatinine Clearance: 82.3 mL/min (by C-G formula based on SCr of 0.48 mg/dL). Liver Function Tests:  Recent Labs Lab 05/07/17 1557 05/08/17 0355  AST 16 17  ALT 19 16  ALKPHOS 72 64  BILITOT 0.8 0.6  PROT 8.0 6.9  ALBUMIN 4.1 3.5    Recent Labs Lab 05/07/17 1557  LIPASE 27   No results for input(s): AMMONIA in the last 168 hours. Coagulation Profile: No results for input(s): INR, PROTIME in the last 168 hours. Cardiac Enzymes: No results for input(s): CKTOTAL, CKMB, CKMBINDEX, TROPONINI in the last 168 hours. BNP (last 3 results) No results for input(s): PROBNP in the last 8760 hours. HbA1C: No results for input(s): HGBA1C in the last 72 hours. CBG:  Recent Labs Lab 05/12/17 1203 05/12/17 1806 05/13/17 0006 05/13/17 0657 05/13/17 1207  GLUCAP 98 86 96 97 85   Lipid Profile:  Recent Labs  05/11/17 0352  CHOL 253*  HDL 30*  LDLCALC UNABLE TO CALCULATE IF TRIGLYCERIDE OVER 400 mg/dL  TRIG 829*  CHOLHDL 8.4   Thyroid Function Tests: No results for input(s): TSH, T4TOTAL, FREET4, T3FREE, THYROIDAB in the last 72 hours. Anemia Panel: No results for input(s): VITAMINB12, FOLATE, FERRITIN, TIBC, IRON, RETICCTPCT in the last 72 hours. Urine analysis:    Component Value Date/Time   COLORURINE YELLOW 05/07/2017 1555   APPEARANCEUR HAZY (A) 05/07/2017 1555   LABSPEC 1.017 05/07/2017 1555   PHURINE 5.0 05/07/2017 1555    GLUCOSEU NEGATIVE 05/07/2017 1555   HGBUR NEGATIVE 05/07/2017 1555   BILIRUBINUR NEGATIVE 05/07/2017 1555   KETONESUR 20 (A) 05/07/2017 1555   PROTEINUR NEGATIVE 05/07/2017 1555   NITRITE NEGATIVE 05/07/2017 1555   LEUKOCYTESUR NEGATIVE 05/07/2017 1555   Sepsis Labs: @LABRCNTIP (procalcitonin:4,lacticidven:4)  )No results found for this or any previous visit (from the past 240 hour(s)).    Radiology Studies: No results found.   Scheduled Meds: . carvedilol  25 mg Oral BID  . feeding supplement  1 Container Oral TID BM  . folic acid  1 mg Oral Daily  . lipase/protease/amylase  12,000 Units Oral TID WC  . magnesium oxide  400 mg Oral BID  . multivitamin with minerals  1 tablet Oral Daily  . niacin  200 mg Oral QHS  . omega-3 acid ethyl  esters  1 g Oral Daily  . thiamine  100 mg Oral Daily   Or  . thiamine  100 mg Intravenous Daily   Continuous Infusions: . dextrose 5 % and 0.9% NaCl 50 mL/hr at 05/12/17 1213  . famotidine (PEPCID) IV Stopped (05/13/17 1231)     LOS: 5 days   Time Spent in minutes   30 minutes  Demetrius Barrell D.O. on 05/13/2017 at 1:53 PM  Between 7am to 7pm - Pager - [phone]  After 7pm go to [email or web address] - password [email or web address]  And look for the night coverage person covering for me after hours  [facility] Office  [phone]

## 2017-05-13 NOTE — Progress Notes (Signed)
5 Days Post-Op    CC:  Abdominal pain  Subjective: Tolerating clears, still has pain LUQ, but it is better.    Objective: Vital signs in last 24 hours: Temp:  [98.2 F (36.8 C)-98.6 F (37 C)] 98.2 F (36.8 C) (05/16 0552) Pulse Rate:  [70] 70 (05/15 2223) Resp:  [16] 16 (05/16 0552) BP: (146-149)/(96-97) 146/97 (05/16 0552) SpO2:  [98 %] 98 % (05/16 0552) Last BM Date: 05/10/17 1080 PO Voided x 5 Stool x 1 this AM Afebrile, VSS BP up some Labs ok No films Intake/Output from previous day: 05/15 0701 - 05/16 0700 In: 1130 [P.O.:1080; IV Piggyback:50] Out: -  Intake/Output this shift: Total I/O In: 250 [P.O.:250] Out: 1 [Urine:1]  Resp: clear to auscultation bilaterally and anterior exam GI: soft, still tender LUQ.  tolerating clears  Lab Results:   Recent Labs  05/12/17 0347 05/13/17 0337  WBC 6.3 9.4  HGB 10.6* 11.5*  HCT 31.4* 34.0*  PLT 375 407*    BMET  Recent Labs  05/12/17 0347 05/13/17 0337  NA 137 139  K 3.6 3.9  CL 104 107  CO2 23 25  GLUCOSE 116* 110*  BUN <5* 6  CREATININE 0.43* 0.48  CALCIUM 9.1 9.4   PT/INR No results for input(s): LABPROT, INR in the last 72 hours.   Recent Labs Lab 05/07/17 1557 05/08/17 0355  AST 16 17  ALT 19 16  ALKPHOS 72 64  BILITOT 0.8 0.6  PROT 8.0 6.9  ALBUMIN 4.1 3.5     Lipase     Component Value Date/Time   LIPASE 27 05/07/2017 1557     Medications: . carvedilol  25 mg Oral BID  . feeding supplement  1 Container Oral TID BM  . folic acid  1 mg Oral Daily  . lipase/protease/amylase  12,000 Units Oral TID WC  . magnesium oxide  400 mg Oral BID  . multivitamin with minerals  1 tablet Oral Daily  . niacin  200 mg Oral QHS  . omega-3 acid ethyl esters  1 g Oral Daily  . thiamine  100 mg Oral Daily   Or  . thiamine  100 mg Intravenous Daily    Assessment/Plan Left lower quadrant Abdominal pain with hx of pancreatitis EGD 05/08/17 Dr. Marina Goodell - normal 7.6 x 7.1 x 4.4 cm masslike  Inflammatory mass lesser sac Chronic pancreatic insufficiency History of hypertriglyceridemia requiring plasmapheresis History CHF/nonischemic cardiomyopathy History of transfusion for spontaneous splenic bleed. Hypertension FEN: IV fluids/clear diet started 5/15 ID: None DVT: SCDs only   Plan:  Dr. Gerrit Friends recommends she undergo repeat imaging.  You may want to discuss with Radiology, bet modality for looking again.  She would like to eat and get out of here.  LOS: 5 days    Joyce Cline 05/13/2017 [phone]

## 2017-05-14 ENCOUNTER — Inpatient Hospital Stay (HOSPITAL_COMMUNITY): Payer: No Typology Code available for payment source

## 2017-05-14 ENCOUNTER — Encounter (HOSPITAL_COMMUNITY): Payer: Self-pay | Admitting: Radiology

## 2017-05-14 DIAGNOSIS — R935 Abnormal findings on diagnostic imaging of other abdominal regions, including retroperitoneum: Secondary | ICD-10-CM

## 2017-05-14 DIAGNOSIS — R0602 Shortness of breath: Secondary | ICD-10-CM

## 2017-05-14 LAB — GLUCOSE, CAPILLARY
GLUCOSE-CAPILLARY: 92 mg/dL (ref 65–99)
Glucose-Capillary: 105 mg/dL — ABNORMAL HIGH (ref 65–99)
Glucose-Capillary: 113 mg/dL — ABNORMAL HIGH (ref 65–99)

## 2017-05-14 LAB — BASIC METABOLIC PANEL
Anion gap: 10 (ref 5–15)
BUN: 6 mg/dL (ref 6–20)
CALCIUM: 9.3 mg/dL (ref 8.9–10.3)
CHLORIDE: 107 mmol/L (ref 101–111)
CO2: 23 mmol/L (ref 22–32)
CREATININE: 0.48 mg/dL (ref 0.44–1.00)
GFR calc non Af Amer: >60 mL/min (ref >60)
GLUCOSE: 118 mg/dL — AB (ref 65–99)
Potassium: 3.6 mmol/L (ref 3.5–5.1)
Sodium: 140 mmol/L (ref 135–145)

## 2017-05-14 LAB — CBC
HEMATOCRIT: 34.2 % — AB (ref 36.0–46.0)
HEMOGLOBIN: 11.4 g/dL — AB (ref 12.0–15.0)
MCH: 32 pg (ref 26.0–34.0)
MCHC: 33.3 g/dL (ref 30.0–36.0)
MCV: 96.1 fL (ref 78.0–100.0)
Platelets: 405 10*3/uL — ABNORMAL HIGH (ref 150–400)
RBC: 3.56 MIL/uL — ABNORMAL LOW (ref 3.87–5.11)
RDW: 12.6 % (ref 11.5–15.5)
WBC: 5.4 10*3/uL (ref 4.0–10.5)

## 2017-05-14 MED ORDER — IOPAMIDOL (ISOVUE-300) INJECTION 61%
INTRAVENOUS | Status: AC
  Filled 2017-05-14: qty 100

## 2017-05-14 MED ORDER — FOLIC ACID 1 MG PO TABS: 1.0000 mg | ORAL_TABLET | Freq: Every day | ORAL | 0 refills | Status: DC

## 2017-05-14 MED ORDER — NIACIN 100 MG PO TABS: 200.0000 mg | ORAL_TABLET | Freq: Every day | ORAL | 0 refills | Status: DC

## 2017-05-14 MED ORDER — IOPAMIDOL (ISOVUE-300) INJECTION 61%
30.0000 mL | Freq: Once | INTRAVENOUS | Status: AC | PRN
  Administered 2017-05-14: 30 mL via ORAL

## 2017-05-14 MED ORDER — IOPAMIDOL (ISOVUE-300) INJECTION 61%
INTRAVENOUS | Status: AC
  Administered 2017-05-14: 100 mL
  Filled 2017-05-14: qty 30

## 2017-05-14 MED ORDER — THIAMINE HCL 100 MG PO TABS: 100.0000 mg | ORAL_TABLET | Freq: Every day | ORAL | 0 refills | Status: DC

## 2017-05-14 NOTE — Progress Notes (Signed)
Patient has a phlebitis in left forearm from previous iv site, warm compresses applied, patient was instructed to keep applying warm compresses at home and if gets worse to show it to her PCP, patient verbalized understanding of instructions.

## 2017-05-14 NOTE — Discharge Instructions (Signed)
Acute Pancreatitis ° °Acute pancreatitis is a condition in which the pancreas suddenly becomes irritated and swollen (has inflammation). The pancreas is a gland that is located behind the stomach. It produces enzymes that help to digest food. The pancreas also releases the hormones glucagon and insulin, which help to regulate blood sugar. Damage to the pancreas occurs when the digestive enzymes from the pancreas are activated before they are released into the intestine. °Most acute attacks last a couple of days and can cause serious problems. Some people become dehydrated and develop low blood pressure. In severe cases, bleeding into the pancreas can lead to shock and can be life-threatening. The lungs, heart, and kidneys may fail. °What are the causes? °The most common causes of this condition are: °· Alcohol abuse. °· Gallstones. °Other causes include: °· Certain medicines. °· Exposure to certain chemicals. °· Infection. °· Damage caused by an accident (trauma). °· Abdominal surgery. °In some cases, the cause may not be known. °What are the signs or symptoms? °Symptoms of this condition include: °· Pain in the upper abdomen that may radiate to the back. °· Tenderness and swelling of the abdomen. °· Nausea and vomiting. °How is this diagnosed? °This condition may be diagnosed based on: °· A physical exam. °· Blood tests. °· Imaging tests, such as X-rays, CT scans, or an ultrasound of the abdomen. °How is this treated? °Treatment for this condition usually requires a stay in the hospital. Treatment may include: °· Pain medicine. °· Fluid replacement through an IV tube. °· Placing a tube in the stomach to remove stomach contents and to control vomiting (NG tube, or nasogastric tube). °· Not eating for 3-4 days. This gives the pancreas a rest, because enzymes are not being produced that can cause further damage. °· Antibiotic medicines, if your condition is caused by an infection. °· Surgery on the pancreas or  gallbladder. °Follow these instructions at home: °Eating and drinking  °· Follow instructions from your health care provider about diet. This may involve avoiding alcohol and decreasing the amount of fat in your diet. °· Eat smaller, more frequent meals. This reduces the amount of digestive fluids that the pancreas produces. °· Drink enough fluid to keep your urine clear or pale yellow. °· Do not drink alcohol if it caused your condition. °General instructions  °· Take over-the-counter and prescription medicines only as told by your health care provider. °· Do not use any tobacco products, such as cigarettes, chewing tobacco, and e-cigarettes. If you need help quitting, ask your health care provider. °· Get plenty of rest. °· If directed, check your blood sugar at home as told by your health care provider. °· Keep all follow-up visits as told by your health care provider. This is important. °Contact a health care provider if: °· You do not recover as quickly as expected. °· You develop new or worsening symptoms. °· You have persistent pain, weakness, or nausea. °· You recover and then have another episode of pain. °· You have a fever. °Get help right away if: °· You cannot eat or keep fluids down. °· Your pain becomes severe. °· Your skin or the white part of your eyes turns yellow (jaundice). °· You vomit. °· You feel dizzy or you faint. °· Your blood sugar is high (over 300 mg/dL). °This information is not intended to replace advice given to you by your health care provider. Make sure you discuss any questions you have with your health care provider. °Document Released: 12/15/2005 Document   Revised: 04/23/2016 Document Reviewed: 09/18/2015 °Elsevier Interactive Patient Education © 2017 [employer] ° °

## 2017-05-14 NOTE — Progress Notes (Signed)
  General Surgery [facility]  Assessment & Plan: Abdominal pain LUQ History of pancreatitis "Mass" in lesser sac, ?etiology  No complaints, comfortable, wants to go home  CT scan with contrast ordered for today - will review   Patient appears significantly improved, tolerating regular diet, visiting with friend in room.  I will review CT scan today and make further recommendations based on findings.  May need additional studies, imaging, which can likely be done as out-patient.  Likely home later today unless unsuspected findings on CT.         Velora Heckler, MD, [facility]       Office: [phone]    Chief Complaint: Abdominal pain, possible "mass" LUQ  Subjective: Patient comfortable in bed, friend at bedside, tolerating regular diet  Objective: Vital signs in last 24 hours: Temp:  [98.2 F (36.8 C)-98.4 F (36.9 C)] 98.4 F (36.9 C) (05/17 0643) Pulse Rate:  [72-76] 72 (05/17 0643) Resp:  [16-17] 16 (05/17 0643) BP: (120-146)/(73-91) 146/91 (05/17 0643) SpO2:  [95 %-98 %] 95 % (05/17 0643) Last BM Date: 05/10/17  Intake/Output from previous day: 05/16 0701 - 05/17 0700 In: 1470 [P.O.:250; I.V.:1070; IV Piggyback:150] Out: 2 [Urine:2] Intake/Output this shift: Total I/O In: -  Out: 1 [Urine:1]  Physical Exam: HEENT - sclerae clear, mucous membranes moist Neck - soft Abdomen - soft without distension; BS present; mild tenderness RUQ, epigastrium, and LUQ without guarding or mass Ext - no edema, non-tender Neuro - alert & oriented, no focal deficits  Lab Results:   Recent Labs  05/13/17 0337 05/14/17 0333  WBC 9.4 5.4  HGB 11.5* 11.4*  HCT 34.0* 34.2*  PLT 407* 405*   BMET  Recent Labs  05/13/17 0337 05/14/17 0333  NA 139 140  K 3.9 3.6  CL 107 107  CO2 25 23  GLUCOSE 110* 118*  BUN 6 6  CREATININE 0.48 0.48  CALCIUM 9.4 9.3   PT/INR No results for input(s): LABPROT, INR in the last  72 hours. Comprehensive Metabolic Panel:    Component Value Date/Time   NA 140 05/14/2017 0333   NA 139 05/13/2017 0337   K 3.6 05/14/2017 0333   K 3.9 05/13/2017 0337   CL 107 05/14/2017 0333   CL 107 05/13/2017 0337   CO2 23 05/14/2017 0333   CO2 25 05/13/2017 0337   BUN 6 05/14/2017 0333   BUN 6 05/13/2017 0337   CREATININE 0.48 05/14/2017 0333   CREATININE 0.48 05/13/2017 0337   GLUCOSE 118 (H) 05/14/2017 0333   GLUCOSE 110 (H) 05/13/2017 0337   CALCIUM 9.3 05/14/2017 0333   CALCIUM 9.4 05/13/2017 0337   AST 17 05/08/2017 0355   AST 16 05/07/2017 1557   ALT 16 05/08/2017 0355   ALT 19 05/07/2017 1557   ALKPHOS 64 05/08/2017 0355   ALKPHOS 72 05/07/2017 1557   BILITOT 0.6 05/08/2017 0355   BILITOT 0.8 05/07/2017 1557   PROT 6.9 05/08/2017 0355   PROT 8.0 05/07/2017 1557   ALBUMIN 3.5 05/08/2017 0355   ALBUMIN 4.1 05/07/2017 1557    Studies/Results: No results found.    Tyrone Pautsch M 05/14/2017  Patient ID: Joyce Cline, female   DOB: 12/14/1971, 46 y.o.   MRN: [id number]

## 2017-05-14 NOTE — Discharge Summary (Signed)
Physician Discharge Summary  Joyce Cline ZOX:[id number] DOB: 01/18/1971 DOA: 05/07/2017  PCP: Joyce Cline, Joyce Cline  Admit date: 05/07/2017 Discharge date: 05/14/2017  Time spent: 45 minutes  Recommendations for Outpatient Follow-up:  Joyce Cline will be discharged to home.  Joyce Cline will need to follow up with primary care provider within one week of discharge.  Follow up with Dr. Donell Beers, general surgeon, in 4 weeks. You will need a repeat CT scan in 6-8 weeks. Joyce Cline should continue medications as prescribed.  Joyce Cline should follow a heart healthy diet.   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 Delrae Sawyers Joyce Macarthy-Petersis a 46 y.o.femalewith history of chronic pancreatitis secondary to hypertriglyceridemia previously requiring plasmapheresis who had just moved from [place] to [place] 6 months ago presents to the ER with complaints of abdominal pain. Joyce Cline has been having left upper quadrant pain stabbing in nature radiating to the back. Last 4 days. Has been having nausea denies vomiting has been having some loose stools. Denies any fever or chills or any recent sick contacts or use of antibiotics.  Hospital Course:  Abdominal pain -with possible mass -CT change is suspected to be related to Joyce Cline's acute on chronic pancreatitis -Gen. surgery consulted and appreciated, pending further recommendations and possibly more imaging -Gastroenterology consulted and appreciated, status post EGD which was normal -Discussed with radiology, Dr.  Reche Dixon. In the setting of pancreatitis, he feels the abnormal findings of the CT scan on 5/10 were likely due to pancreatitis/gastritis, fluid collection. Recommended repeat CT when Joyce Cline's symptoms have improved. -Repeat CT abd/pelvis with contrast today: improving pancreatitis, persistent fluid collection measuring 4.2 x 1.7cm - developing or residual pseudocyst.  Acute on chronic pancreatitis secondary to hypertriglyceridemia -initially placed on IVF and conservative management with pain control, antiemetics -Tolerated clear diet, advanced to regular (Joyce Cline is a vegan, limited to choices) -Continue Creon  History of nonischemic cardiomyopathy -Continue Coreg  Essential hypertension -Continue Coreg  Normocytic anemia -Anemia panel showed ferritin of 303, iron 43, TIBC 337, transferrin saturation 13 -Continue iron supplementation -Hemoglobin currently stable, 11.4  Dyslipidemia -Joyce Cline unable to tolerate enough Ebright or gemfibrozil due to rhabdomyolysis -Continue niacin, Lovaza -Lipid panel showed total cholesterol 253, HDL 30, LDL unable to calculate, triglycerides 41 (Cline Joyce Cline TG >1000 in the past)  Hypokalemia -resolved, monitor BMP  Procedures: EGD  Consultations: General surgery Gastroenterology Radiology, via phone  Discharge Exam: Vitals:   05/14/17 0643 05/14/17 1432  BP: (!) 146/91 (!) 161/86  Pulse: 72 71  Resp: 16 18  Temp: 98.4 F (36.9 C) 98.4 F (36.9 C)   Joyce Cline feeling better today. Able to tolerate diet. Feels abdominal pain is back to her baseline. Denies nausea, vomiting, chest pain, shortness of breath, dizziness, headache.   General: Well developed, well nourished, NAD  HEENT: NCAT, mucous membranes moist.  Cardiovascular: S1 S2 auscultated, Joyce rubs, murmurs or gallops. Regular rate and rhythm.  Respiratory: Clear to auscultation bilaterally with equal chest rise  Abdomen: Soft, mild RUQ/epigastric/LUQ TTP, nondistended, +  bowel sounds  Extremities: warm dry without cyanosis clubbing or edema  Neuro: AAOx3, nonfocal  Psych: Normal affect and demeanor  with intact judgement and insight  Discharge Instructions Discharge Instructions    Discharge instructions    Complete by:  As directed    Joyce Cline will be discharged to home.  Joyce Cline will need to follow up with primary care provider within one week of discharge.  Follow up with Dr. Donell Beers, general surgeon, in 4 weeks. You will need a repeat CT scan in 6-8 weeks. Joyce Cline should continue medications as prescribed.  Joyce Cline should follow a heart healthy diet.     Current Discharge Medication List    START taking these medications   Details  folic acid (FOLVITE) 1 MG tablet Take 1 tablet (1 mg total) by mouth daily. Qty: 30 tablet, Refills: 0    niacin 100 MG tablet Take 2 tablets (200 mg total) by mouth at bedtime. Qty: 60 tablet, Refills: 0    thiamine 100 MG tablet Take 1 tablet (100 mg total) by mouth daily. Qty: 30 tablet, Refills: 0      CONTINUE these medications which have NOT CHANGED   Details  amLODipine (NORVASC) 5 MG tablet Take 5 mg by mouth daily. Refills: 0    carvedilol (COREG) 25 MG tablet Take 25 mg by mouth 2 (two) times daily. Refills: 0    Chromium 1000 MCG TABS Take 1,000 mcg by mouth daily.     CREON 6000 units CPEP Take 6,000-12,000 Units by mouth 3 (three) times daily with meals.  Refills: 0    Cyanocobalamin (VITAMIN B-12) 6000 MCG SUBL Place 6,000 mcg under the tongue daily.    ibuprofen (ADVIL,MOTRIN) 200 MG tablet Take 600 mg by mouth every 6 (six) hours as needed for headache, mild pain or moderate pain.    Magnesium 500 MG TABS Take 500 mg by mouth at bedtime.    Multiple Vitamin (MULTIVITAMIN WITH MINERALS) TABS tablet Take 1 tablet by mouth daily.    omega-3 acid ethyl esters (LOVAZA) 1 g capsule Take 1 g by mouth daily.    traMADol (ULTRAM) 50 MG tablet Take 50 mg by mouth every 6 (six) hours as needed for  moderate pain.     Turmeric 500 MG CAPS Take 500 mg by mouth 2 (two) times daily.       Joyce Known Allergies Follow-up Information    primary care physician. Schedule an appointment as soon as possible for a visit in 1 week(s).   Why:  Establish care and follow up       Almond Lint, MD. Schedule an appointment as soon as possible for a visit in 4 week(s).   Specialty:  General Surgery Why:  To follow up on developing pseudocyst. Contact information: [address] [place] [address] [phone]            The results of significant diagnostics from this hospitalization (including imaging, microbiology, ancillary and laboratory) are listed below for reference.    Significant Diagnostic Studies: Ct Abdomen Pelvis W Contrast  Result Date: 05/14/2017 CLINICAL DATA:  Pancreatitis. EXAM: CT ABDOMEN AND PELVIS WITH CONTRAST TECHNIQUE: Multidetector CT imaging of the abdomen and pelvis was performed using the standard protocol following bolus administration of intravenous contrast. CONTRAST:  ISOVUE-300 IOPAMIDOL (ISOVUE-300) INJECTION 61%, 89mL ISOVUE-300 IOPAMIDOL (ISOVUE-300) INJECTION 61% COMPARISON:  CT scan of May 07, 2017. FINDINGS: Lower chest: Minimal bilateral pleural effusions are noted. Hepatobiliary: Joyce focal liver abnormality is seen. Status post cholecystectomy. Joyce biliary dilatation. Pancreas: The inflammatory changes noted between the stomach and pancreas are significantly improved. 4.2 x  1.7 cm fluid collection with possibly enhancing margins is noted which may represent pseudocyst. Joyce pancreatic ductal dilatation is noted. Spleen: Normal in size without focal abnormality. Adrenals/Urinary Tract: Adrenal glands are unremarkable. Kidneys are normal, without renal calculi, focal lesion, or hydronephrosis. Bladder is unremarkable. Stomach/Bowel: The appendix appears normal. There is Joyce evidence of bowel obstruction. Other than described above findings,  stomach is unremarkable. Vascular/Lymphatic: Joyce significant vascular findings are present. Joyce enlarged abdominal or pelvic lymph nodes. Reproductive: Intrauterine device is noted. Right ovary is unremarkable. 2.8 cm left ovarian cyst is noted. Other: Joyce abdominal wall hernia or abnormality. Joyce abdominopelvic ascites. Musculoskeletal: Joyce acute or significant osseous findings. IMPRESSION: Minimal bilateral pleural effusions. Significantly improved inflammatory changes are noted between the proximal stomach and pancreas, most consistent with improving pancreatitis. Persistent fluid collection measuring 4.2 x 1.7 cm is noted with enhancing margins suggesting developing or residual pseudocyst. Grossly stable left ovarian cyst. Electronically Signed   By: Lupita Raider, M.D.   On: 05/14/2017 15:46   Ct Abdomen Pelvis W Contrast  Result Date: 05/07/2017 CLINICAL DATA:  Acute onset of generalized abdominal pain and distention. Nausea. Initial encounter. EXAM: CT ABDOMEN AND PELVIS WITH CONTRAST TECHNIQUE: Multidetector CT imaging of the abdomen and pelvis was performed using the standard protocol following bolus administration of intravenous contrast. CONTRAST:  100 mL ISOVUE-300 IOPAMIDOL (ISOVUE-300) INJECTION 61% COMPARISON:  None. FINDINGS: Lower chest: The visualized lung bases are grossly clear. The visualized portions of the mediastinum are unremarkable. Hepatobiliary: The liver is unremarkable in appearance. The Joyce Cline is status post cholecystectomy, with clips noted at the gallbladder fossa. The common bile duct remains normal in caliber. Pancreas: The pancreas is grossly unremarkable in appearance. However, a vague hypoattenuating masslike density is noted anterior to the distal pancreatic body, described in further detail below. Spleen: The spleen is unremarkable in appearance. Adrenals/Urinary Tract: The adrenal glands are unremarkable in appearance. The kidneys are within normal limits. There is Joyce  evidence of hydronephrosis. Joyce renal or ureteral stones are identified. Joyce perinephric stranding is seen. Stomach/Bowel: There is a heterogeneous hypoattenuating 7.6 x 7.1 x 4.4 cm masslike density posterior to the body of the stomach, with poor distinction of the gastric wall. Mild surrounding soft tissue inflammation is seen, with mildly prominent nodes measuring up to 1.0 cm in short axis. It abuts the pancreas, but appears to be separate from the pancreas. This is most concerning for a gastric malignancy, such as a GI stromal tumor, though a severe gastric ulceration might have a similar appearance. The remainder of the stomach is grossly unremarkable in appearance. The small bowel is grossly unremarkable. The appendix is normal in caliber, without evidence for appendicitis. Scattered diverticulosis is noted along the ascending, descending and sigmoid colon, without evidence of diverticulitis. Vascular/Lymphatic: Minimal calcification is seen along the abdominal aorta and its branches. Joyce retroperitoneal or pelvic sidewall lymphadenopathy is seen. Reproductive: The bladder is mildly distended and grossly unremarkable. The uterus is unremarkable in appearance. An intrauterine device is noted in expected position at the fundus of the uterus. The ovaries are relatively symmetric, aside from a 2.6 cm left-sided follicle. Other: Joyce additional soft tissue abnormalities are seen. Musculoskeletal: Joyce acute osseous abnormalities are identified. The visualized musculature is unremarkable in appearance. IMPRESSION: 1. Heterogeneous hypoattenuating 7.6 x 7.1 x 4.4 cm masslike density posterior to the body of the stomach, with poor distinction of the gastric wall. Mild surrounding soft tissue inflammation, with mildly prominent nodes measuring up to 1.0 cm  in short axis. This is most concerning for gastric malignancy, such as a GI stromal tumor, though a severe gastric ulceration might have a similar appearance. Endoscopy is  recommended for further evaluation. 2. Scattered diverticulosis along the ascending, descending and sigmoid colon, without evidence of diverticulitis. Electronically Signed   By: Roanna Raider M.D.   On: 05/07/2017 18:34   Dg Chest Port 1 View  Result Date: 05/07/2017 CLINICAL DATA:  Shortness of breath. EXAM: PORTABLE CHEST 1 VIEW COMPARISON:  None. FINDINGS: The cardiomediastinal contours are normal. The lungs are clear. Pulmonary vasculature is normal. Joyce consolidation, pleural effusion, or pneumothorax. Joyce acute osseous abnormalities are seen. IMPRESSION: Joyce acute pulmonary process. Electronically Signed   By: Rubye Oaks M.D.   On: 05/07/2017 22:46    Microbiology: Joyce results found for this or any previous visit (from the past 240 hour(s)).   Labs: Basic Metabolic Panel:  Recent Labs Lab 05/08/17 0355  05/10/17 0419 05/11/17 0352 05/12/17 0347 05/13/17 0337 05/14/17 0333  NA 135  < > 138 139 137 139 140  K 3.6  < > 3.8 3.6 3.6 3.9 3.6  CL 104  < > 105 104 104 107 107  CO2 23  < > 25 25 23 25 23   GLUCOSE 123*  < > 111* 119* 116* 110* 118*  BUN 5*  < > <5* <5* <5* 6 6  CREATININE 0.47  < > 0.46 0.46 0.43* 0.48 0.48  CALCIUM 8.7*  < > 9.3 9.1 9.1 9.4 9.3  MG 2.0  --   --   --  1.9  --   --   < > = values in this interval not displayed. Liver Function Tests:  Recent Labs Lab 05/08/17 0355  AST 17  ALT 16  ALKPHOS 64  BILITOT 0.6  PROT 6.9  ALBUMIN 3.5   Joyce results for input(s): LIPASE, AMYLASE in the last 168 hours. Joyce results for input(s): AMMONIA in the last 168 hours. CBC:  Recent Labs Lab 05/09/17 0436 05/10/17 0419 05/12/17 0347 05/13/17 0337 05/14/17 0333  WBC 6.9 8.0 6.3 9.4 5.4  NEUTROABS 3.9 4.7 3.3 5.8  --   HGB 10.3* 10.2* 10.6* 11.5* 11.4*  HCT 31.5* 30.7* 31.4* 34.0* 34.2*  MCV 99.1 97.5 96.3 96.0 96.1  PLT 292 295 375 407* 405*   Cardiac Enzymes: Joyce results for input(s): CKTOTAL, CKMB, CKMBINDEX, TROPONINI in the last 168  hours. BNP: BNP (last 3 results) Joyce results for input(s): BNP in the last 8760 hours.  ProBNP (last 3 results) Joyce results for input(s): PROBNP in the last 8760 hours.  CBG:  Recent Labs Lab 05/13/17 1207 05/13/17 1800 05/14/17 0052 05/14/17 0640 05/14/17 1150  GLUCAP 85 125* 113* 105* 92       Signed:  Tarron Krolak  [facility] 05/14/2017, 5:39 PM

## 2017-05-14 NOTE — Progress Notes (Signed)
Plan of care reviewed with patient. Patient persistent upper abdominal pain; pain control regimen utilized with some relief. Patient has not had BM since 05/10/2017; pt reports some discomfort and desire for a BM to feel release. She is requesting a stool softener or laxative to assist. Will continue to monitor for changes.

## 2017-05-14 NOTE — Progress Notes (Signed)
Pt from home with mother and independent with ADLs prior to admission. CM will continue to follow for DC needs. Arna Medici Holton Sidman,RN,BSN,NCM [phone]

## 2017-06-02 ENCOUNTER — Ambulatory Visit (INDEPENDENT_AMBULATORY_CARE_PROVIDER_SITE_OTHER): Payer: No Typology Code available for payment source | Admitting: Physician Assistant

## 2017-06-02 ENCOUNTER — Encounter: Payer: Self-pay | Admitting: Physician Assistant

## 2017-06-02 ENCOUNTER — Encounter: Payer: Self-pay | Admitting: Internal Medicine

## 2017-06-02 VITALS — BP 136/82 | HR 82 | Temp 98.6°F | Ht 62.5 in | Wt 161.0 lb

## 2017-06-02 DIAGNOSIS — I10 Essential (primary) hypertension: Secondary | ICD-10-CM | POA: Diagnosis not present

## 2017-06-02 DIAGNOSIS — E781 Pure hyperglyceridemia: Secondary | ICD-10-CM | POA: Diagnosis not present

## 2017-06-02 DIAGNOSIS — E538 Deficiency of other specified B group vitamins: Secondary | ICD-10-CM | POA: Diagnosis not present

## 2017-06-02 DIAGNOSIS — R109 Unspecified abdominal pain: Secondary | ICD-10-CM | POA: Diagnosis not present

## 2017-06-02 DIAGNOSIS — Z23 Encounter for immunization: Secondary | ICD-10-CM | POA: Diagnosis not present

## 2017-06-02 DIAGNOSIS — I428 Other cardiomyopathies: Secondary | ICD-10-CM

## 2017-06-02 DIAGNOSIS — K861 Other chronic pancreatitis: Secondary | ICD-10-CM

## 2017-06-02 DIAGNOSIS — K863 Pseudocyst of pancreas: Secondary | ICD-10-CM | POA: Insufficient documentation

## 2017-06-02 HISTORY — DX: Deficiency of other specified B group vitamins: E53.8

## 2017-06-02 LAB — LIPID PANEL
Cholesterol: 356 mg/dL — ABNORMAL HIGH (ref 0–200)
HDL: 43.1 mg/dL (ref >39.00)
Total CHOL/HDL Ratio: 8

## 2017-06-02 LAB — COMPREHENSIVE METABOLIC PANEL
ALT: 20 U/L (ref 0–35)
AST: 16 U/L (ref 0–37)
Albumin: 4.7 g/dL (ref 3.5–5.2)
Alkaline Phosphatase: 57 U/L (ref 39–117)
BUN: 11 mg/dL (ref 6–23)
CALCIUM: 10.4 mg/dL (ref 8.4–10.5)
CHLORIDE: 102 meq/L (ref 96–112)
CO2: 25 meq/L (ref 19–32)
Creatinine, Ser: 0.51 mg/dL (ref 0.40–1.20)
GFR: 136.99 mL/min (ref >60.00)
Glucose, Bld: 87 mg/dL (ref 70–99)
POTASSIUM: 3.7 meq/L (ref 3.5–5.1)
Sodium: 137 mEq/L (ref 135–145)
Total Bilirubin: 0.7 mg/dL (ref 0.2–1.2)
Total Protein: 7.5 g/dL (ref 6.0–8.3)

## 2017-06-02 LAB — CBC WITH DIFFERENTIAL/PLATELET
BASOS PCT: 0.8 % (ref 0.0–3.0)
Basophils Absolute: 0 10*3/uL (ref 0.0–0.1)
EOS PCT: 3 % (ref 0.0–5.0)
Eosinophils Absolute: 0.2 10*3/uL (ref 0.0–0.7)
HEMATOCRIT: 39.2 % (ref 36.0–46.0)
Hemoglobin: 13.6 g/dL (ref 12.0–15.0)
LYMPHS PCT: 49.7 % — AB (ref 12.0–46.0)
Lymphs Abs: 3.2 10*3/uL (ref 0.7–4.0)
MCHC: 34.7 g/dL (ref 30.0–36.0)
MCV: 94.6 fl (ref 78.0–100.0)
Monocytes Absolute: 0.4 10*3/uL (ref 0.1–1.0)
Monocytes Relative: 6.2 % (ref 3.0–12.0)
NEUTROS ABS: 2.6 10*3/uL (ref 1.4–7.7)
Neutrophils Relative %: 40.3 % — ABNORMAL LOW (ref 43.0–77.0)
PLATELETS: 317 10*3/uL (ref 150.0–400.0)
RBC: 4.14 Mil/uL (ref 3.87–5.11)
RDW: 13.4 % (ref 11.5–15.5)
WBC: 6.4 10*3/uL (ref 4.0–10.5)

## 2017-06-02 LAB — VITAMIN B12

## 2017-06-02 LAB — LDL CHOLESTEROL, DIRECT: Direct LDL: 75 mg/dL

## 2017-06-02 MED ORDER — CREON 6000-19000 UNITS PO CPEP: 6000.0000 [IU] | ORAL_CAPSULE | Freq: Three times a day (TID) | ORAL | 0 refills | Status: DC

## 2017-06-02 MED ORDER — AMLODIPINE BESYLATE 5 MG PO TABS
5.0000 mg | ORAL_TABLET | Freq: Every day | ORAL | 1 refills | Status: DC
Start: 2017-06-02 — End: 2017-09-07

## 2017-06-02 MED ORDER — CARVEDILOL 25 MG PO TABS: 25.0000 mg | ORAL_TABLET | Freq: Two times a day (BID) | ORAL | 1 refills | Status: DC

## 2017-06-02 NOTE — Assessment & Plan Note (Signed)
Well-controlled with Coreg and amlodipine. We'll refill today for follow-up in 3 months. She is going to establish with cardiology.

## 2017-06-02 NOTE — Progress Notes (Signed)
Joyce Cline is a 46 y.o. female here to Establish Care  I acted as a Neurosurgeon for [employer], PA-C Corky Mull, LPN  History of Present Illness:   Chief Complaint  Patient presents with  . Establish Care    [employer]  . Pseudo cyst  . Weight Loss  . Fatigue    Left [place] in September [place] to [place]  She is here to establish care as well as follow up from her hospital admission on 05/07/17 - 05/14/17. She presented to the ED with abdominal pain that was radiating to her back and loose stools. Initial work-up in ED showed mild anemia, and CT revealed gastric mass and diverticulosis. EGD on 05/08/17 was normal. She was diagnosed with acute on chronic pancreatitis 2/2 hypertriglyceridemia. Surgery followed patient but no operative intervention occurred. She has a follow-up scheduled with [facility] in early July.  Acute Concerns: Abdominal pain -- patient reports since leaving the hospital she has had decreased appetite, early satiety, and chronic abdominal pain. She lost about 4 lb since going to the hospital. She was NPO several days there and when she was advanced to a diet had very limited options 2/2 her dietary restrictions of being vegan. She denies fevers, chills, nausea. She has had intermittent loose stools. She has a history of diverticulosis -- this was shown on initial abdominal CT on 05/07/2017.  Pseudocyst -- per 05/14/17 repeat CT, patient with "Persistent fluid collection measuring 4.2 x 1.7 cm is noted with enhancing margins suggesting developing or residual pseudocyst." She denies any fevers. She was not put on any antibiotics while in the hospital. Discharging MD at [facility] recommends a repeat CT scan in 6-8 weeks. B12 deficiency -- has a history of deficiency and requires supplementation, when first switched for veganism, had migraines that were attributed to B12 deficiency  Chronic Issues: Hypertriglcyerides  -- has dealt with this most of her life, hx of triglycerides ranging from 1500-2300, goal at last PCP was to keep triglycerides under 1000. She states that she has tried: Fenofibrate, Gemfibrozil, Niacin, Vascepa. Some of them, she cannot remember which, caused her to have muscle fatigue/aches. She is currently taking 2 Niacin daily now as was started in the hospital. She thinks that she was responding well to Vascepa however she states that her prior insurance company stopped paying for it and she could not afford it. She follows a strict vegan diet and tries to keep her dietary fat <30 grams daily. Pancreatitis -- has had 3 episodes of pancreatitis:  --1)  2014 was hospitalized 45 days received TPN -- since then has been on Creon --2)  2016 received plasmapheresis x 1 and did not require long hospitalization --3)  2018 see above Nonischemic cardiomyopathy - in 2007, had an instance where she developed CHF and required gallbladder removal, work-up revealed a "virus". I do not have these records. She states that her EF was down to 20% but is now on "yearly echo" schedules and EF is normal. She has not had an echo in about 1.5 years per her report. She is on Coreg and Amlodipine daily. HTN -- well controlled, on Amlodopine and Coreg daily  Health Maintenance: Immunizations -- needs Tdap today Weight -- Weight: 161 lb (73 kg)   Depression screen PHQ 2/9 06/02/2017  Decreased Interest 0  Down, Depressed, Hopeless 0  PHQ - 2 Score 0    Other providers/specialists: In [facility] she had the following specialists:  GI doctor  Cardiologist  Pain management -- only for post 45-day hospitalization and management of chronic pain meds which she is no longer on for pancreatitis Rheumatology -- had extensive work-up for autoimmune dz but nothing was ever found per her knowledge   PMHx, SurgHx, SocialHx, Medications, and Allergies were reviewed in the Visit Navigator and updated as appropriate.  Current  Medications:   Current Outpatient Prescriptions:  .  amLODipine (NORVASC) 5 MG tablet, Take 1 tablet (5 mg total) by mouth daily., Disp: 90 tablet, Rfl: 1 .  carvedilol (COREG) 25 MG tablet, Take 1 tablet (25 mg total) by mouth 2 (two) times daily., Disp: 90 tablet, Rfl: 1 .  Chromium 1000 MCG TABS, Take 1,000 mcg by mouth daily. , Disp: , Rfl:  .  CREON 6000 units CPEP, Take 1-2 capsules (6,000-12,000 Units total) by mouth 3 (three) times daily with meals., Disp: 450 capsule, Rfl: 0 .  Cyanocobalamin (VITAMIN B-12) 6000 MCG SUBL, Place 6,000 mcg under the tongue daily., Disp: , Rfl:  .  folic acid (FOLVITE) 1 MG tablet, Take 1 tablet (1 mg total) by mouth daily., Disp: 30 tablet, Rfl: 0 .  ibuprofen (ADVIL,MOTRIN) 200 MG tablet, Take 600 mg by mouth every 6 (six) hours as needed for headache, mild pain or moderate pain., Disp: , Rfl:  .  Magnesium 500 MG TABS, Take 500 mg by mouth at bedtime., Disp: , Rfl:  .  Multiple Vitamin (MULTIVITAMIN WITH MINERALS) TABS tablet, Take 1 tablet by mouth daily., Disp: , Rfl:  .  niacin 100 MG tablet, Take 2 tablets (200 mg total) by mouth at bedtime., Disp: 60 tablet, Rfl: 0 .  omega-3 acid ethyl esters (LOVAZA) 1 g capsule, Take 1 g by mouth daily., Disp: , Rfl:  .  thiamine 100 MG tablet, Take 1 tablet (100 mg total) by mouth daily., Disp: 30 tablet, Rfl: 0 .  traMADol (ULTRAM) 50 MG tablet, Take 50 mg by mouth every 6 (six) hours as needed for moderate pain. , Disp: , Rfl:  .  Turmeric 500 MG CAPS, Take 500 mg by mouth 2 (two) times daily., Disp: , Rfl:    Review of Systems:   Review of Systems  Constitutional: Positive for malaise/fatigue and weight loss. Negative for chills and fever.       Has lost 4 pounds since out of the hospital. Endoscopy done on 05/08/2017.  HENT: Negative for hearing loss, sinus pain and sore throat.   Eyes: Negative for blurred vision.  Respiratory: Negative for cough and shortness of breath.   Cardiovascular: Negative for  chest pain, palpitations and leg swelling.  Gastrointestinal: Positive for abdominal pain, diarrhea, heartburn and nausea. Negative for blood in stool, constipation and vomiting.       Abdominal pain RUQ and LLQ, having watery stools 3-4 times per day.  Genitourinary: Negative for dysuria, frequency and urgency.  Musculoskeletal: Negative for back pain, myalgias and neck pain.  Skin: Negative for itching and rash.  Neurological: Negative for seizures, loss of consciousness and headaches.  Endo/Heme/Allergies: Negative for polydipsia.  Psychiatric/Behavioral: Negative for depression. The patient is not nervous/anxious.     Vitals:   Vitals:   06/02/17 0853  BP: 136/82  Pulse: 82  Temp: 98.6 F (37 C)  TempSrc: Oral  SpO2: 96%  Weight: 161 lb (73 kg)  Height: 5' 2.5" (1.588 m)     Body mass index is 28.98 kg/m.  Physical Exam:   Physical Exam  Constitutional: She appears  well-developed. She is cooperative.  Non-toxic appearance. She does not have a sickly appearance. She does not appear ill. No distress.  Cardiovascular: Normal rate, regular rhythm, S1 normal, S2 normal, normal heart sounds and normal pulses.   No LE edema  Pulmonary/Chest: Effort normal and breath sounds normal.  Abdominal: There is tenderness in the epigastric area and left lower quadrant. There is no rigidity, no rebound and no guarding.  Neurological: She is alert.  Nursing note and vitals reviewed.    Assessment and Plan:    Problem List Items Addressed This Visit      Cardiovascular and Mediastinum   NICM (nonischemic cardiomyopathy) (HCC)    Continue Coreg. We'll refer to cardiology so she can get back on track with her regularly scheduled echoes. We have also requested her records.      Relevant Medications   carvedilol (COREG) 25 MG tablet   amLODipine (NORVASC) 5 MG tablet   Other Relevant Orders   Ambulatory referral to Cardiology   Hypertension    Well-controlled with Coreg and  amlodipine. We'll refill today for follow-up in 3 months. She is going to establish with cardiology.      Relevant Medications   carvedilol (COREG) 25 MG tablet   amLODipine (NORVASC) 5 MG tablet     Digestive   Chronic pancreatitis (HCC) - Primary    Continue Creon. Referral to gastroenterology to establish care while here. Continue to monitor symptoms, avoid high fat foods.      Relevant Medications   CREON 6000 units CPEP   Other Relevant Orders   Ambulatory referral to Gastroenterology   Pseudocyst of pancreas    Currently stable. She is to follow-up with surgery in a few weeks, and undergo repeat CT. I advised her to follow-up with [place] if she develops any worsening abdominal pain, fevers or other concerns.         Other   Abdominal pain    She does have a history of diverticulosis and is having some left lower quadrant pain. She is going to take probiotics and monitor her pain, and follow-up with [place] if she develops any worsening symptoms. Low threshold to start antibiotics for her.      Relevant Orders   CBC with Differential/Platelet   Comprehensive metabolic panel   Hypertriglyceridemia    Continue treatment with niacin per orders. I'm going to send her to the lipid clinic to have formal evaluation by Pharm.D. to best determine what is the most appropriate medication for her given her history. Patient is agreeable to this plan.      Relevant Medications   carvedilol (COREG) 25 MG tablet   amLODipine (NORVASC) 5 MG tablet   Other Relevant Orders   Lipid panel   Ambulatory referral to Lipid Clinic   B12 deficiency    We will check a B12 today.      Relevant Orders   Vitamin B12    Other Visit Diagnoses    Need for prophylactic vaccination with combined diphtheria-tetanus-pertussis (DTP) vaccine       Relevant Orders   Tdap vaccine greater than or equal to 7yo IM (Completed)     Patient was told by her discharging physician Dr. Catha Gosselin that she should consider a  referral to endocrinology. I am going to to reach out to Dr. Catha Gosselin to understand reasoning for this; I'm not opposed to this is, I just simply want to understand exactly why this was recommended.  . Reviewed expectations re: course of  current medical issues. . Discussed self-management of symptoms. . Outlined signs and symptoms indicating need for more acute intervention. . Patient verbalized understanding and all questions were answered. . See orders for this visit as documented in the electronic medical record. . Patient received an After-Visit Summary.  CMA or LPN served as scribe during this visit. History, Physical, and Plan performed by medical provider. Documentation and orders reviewed and attested to.  [name], PA-C

## 2017-06-02 NOTE — Assessment & Plan Note (Signed)
Currently stable. She is to follow-up with surgery in a few weeks, and undergo repeat CT. I advised her to follow-up with [place] if she develops any worsening abdominal pain, fevers or other concerns.

## 2017-06-02 NOTE — Patient Instructions (Signed)
It was great meeting you today!  We will call you with your lab results.  You will be contacted about your referrals to cardiology, GI and the lipid clinic.  If your abdominal pain increases in any way, please notify us and seek medical attention. Also, please let us know if you develop fevers or other concerns.

## 2017-06-02 NOTE — Assessment & Plan Note (Signed)
Continue Coreg. We'll refer to cardiology so she can get back on track with her regularly scheduled echoes. We have also requested her records.

## 2017-06-02 NOTE — Assessment & Plan Note (Signed)
Continue treatment with niacin per orders. I'm going to send her to the lipid clinic to have formal evaluation by Pharm.D. to best determine what is the most appropriate medication for her given her history. Patient is agreeable to this plan.

## 2017-06-02 NOTE — Assessment & Plan Note (Signed)
She does have a history of diverticulosis and is having some left lower quadrant pain. She is going to take probiotics and monitor her pain, and follow-up with [place] if she develops any worsening symptoms. Low threshold to start antibiotics for her.

## 2017-06-02 NOTE — Assessment & Plan Note (Signed)
We will check a B12 today.

## 2017-06-02 NOTE — Assessment & Plan Note (Signed)
Continue Creon. Referral to gastroenterology to establish care while here. Continue to monitor symptoms, avoid high fat foods.

## 2017-06-03 ENCOUNTER — Other Ambulatory Visit: Payer: Self-pay | Admitting: Physician Assistant

## 2017-06-03 ENCOUNTER — Encounter: Payer: Self-pay | Admitting: Internal Medicine

## 2017-06-03 ENCOUNTER — Encounter: Payer: Self-pay | Admitting: Physician Assistant

## 2017-06-03 DIAGNOSIS — E781 Pure hyperglyceridemia: Secondary | ICD-10-CM

## 2017-06-03 MED ORDER — ICOSAPENT ETHYL 1 G PO CAPS: 2.0000 g | ORAL_CAPSULE | Freq: Two times a day (BID) | ORAL | 5 refills | Status: DC

## 2017-06-04 ENCOUNTER — Other Ambulatory Visit: Payer: Self-pay | Admitting: Physician Assistant

## 2017-06-05 ENCOUNTER — Telehealth: Payer: Self-pay | Admitting: Internal Medicine

## 2017-06-05 ENCOUNTER — Ambulatory Visit (INDEPENDENT_AMBULATORY_CARE_PROVIDER_SITE_OTHER): Payer: No Typology Code available for payment source | Admitting: Internal Medicine

## 2017-06-05 ENCOUNTER — Encounter: Payer: Self-pay | Admitting: Internal Medicine

## 2017-06-05 VITALS — BP 144/100 | HR 90 | Ht 62.5 in | Wt 159.8 lb

## 2017-06-05 DIAGNOSIS — I428 Other cardiomyopathies: Secondary | ICD-10-CM | POA: Diagnosis not present

## 2017-06-05 DIAGNOSIS — E781 Pure hyperglyceridemia: Secondary | ICD-10-CM

## 2017-06-05 DIAGNOSIS — K861 Other chronic pancreatitis: Secondary | ICD-10-CM | POA: Diagnosis not present

## 2017-06-05 MED ORDER — FENOFIBRATE 160 MG PO TABS: 160.0000 mg | ORAL_TABLET | Freq: Every day | ORAL | 3 refills | Status: DC

## 2017-06-05 NOTE — Patient Instructions (Signed)
Medication Instructions:  Stop niacin.  Start Fenofibrate 160 mg daily. Start Fish Oil-use 1000 mg capsules-start taking 2 per day and work your way up to 4 per day for a total of 4000 mg or 4 grams of Fish Oil daily.  Labwork: Your physician recommends that you return for a FASTING lipid profile-- Dr Tenny Craw will discuss with the pharmacist and we will let you know when you need to come back for this.   Testing/Procedures: None   Follow-Up: Follow -up with Dr Tenny Craw to be determined.  Any Other Special Instructions Will Be Listed Below (If Applicable).     If you need a refill on your cardiac medications before your next appointment, please call your pharmacy.

## 2017-06-05 NOTE — Progress Notes (Signed)
Cardiology Office Note   Date:  06/05/2017   ID:  Joyce Cline, DOB March 05, 1971, MRN [id number]  PCP:  Joyce Motto, PA  Cardiologist:   Joyce Pates, MD   Pt is referred by Joyce Cline for continued cardiac care  Hx of CHF and also hypertriglyceridemia   History of Present Illness: Joyce Cline is a 46 y.o. female who previously lived in [place]  MOved here   Pt with a history of congestive CHF in  2007 Admitted in [place]    Echo done Pt says she was told it showed  Cardiomeg LVEF 33%  Cath done  Reported normal   Treated with medicines  Recovered   In 2008 relapse  Meds adjusted   LV function improved    Seen 1x per year  Last echo reported OK  The pt denies SOB  No swelling   No palpitations    2  Lipids  4 years ago   [facility] for pancreatitis in [facility] for 45 days ni 2014  Etiol unclear  Then improved  Relapse in 2016  Plasmapheresis  Trig went down to below 1000  She says that trig will usually be between 1000-2000 Has been on several meds in past (fenofibrate, gemfibrozil, ? Statin.  Vascepa not approved) Did not tolerate   Pt went to vegan diet  2 years ago   Limits fats to  20 to 30 g per day        Current Meds  Medication Sig  . amLODipine (NORVASC) 5 MG tablet Take 1 tablet (5 mg total) by mouth daily.  . carvedilol (COREG) 25 MG tablet Take 1 tablet (25 mg total) by mouth 2 (two) times daily.  . Chromium 1000 MCG TABS Take 1,000 mcg by mouth daily.   Marland Kitchen CREON 6000 units CPEP Take 1-2 capsules (6,000-12,000 Units total) by mouth 3 (three) times daily with meals.  . Cyanocobalamin (VITAMIN B-12) 6000 MCG SUBL Place 6,000 mcg under the tongue daily.  . folic acid (FOLVITE) 1 MG tablet Take 1 tablet (1 mg total) by mouth daily.  Marland Kitchen ibuprofen (ADVIL,MOTRIN) 200 MG tablet Take 600 mg by mouth every 6 (six) hours as needed for headache, mild pain or moderate pain.  . Magnesium 500 MG TABS Take 500 mg by mouth at bedtime.  . Multiple Vitamin  (MULTIVITAMIN WITH MINERALS) TABS tablet Take 1 tablet by mouth daily.  . niacin 100 MG tablet Take 2 tablets (200 mg total) by mouth at bedtime.  . thiamine 100 MG tablet Take 1 tablet (100 mg total) by mouth daily.  . traMADol (ULTRAM) 50 MG tablet Take 50 mg by mouth every 6 (six) hours as needed for moderate pain.   . Turmeric 500 MG CAPS Take 500 mg by mouth 2 (two) times daily.     Allergies:   Patient has no known allergies.   Past Medical History:  Diagnosis Date  . Anxiety   . History of blood transfusion   . Hypertension   . NICM (nonischemic cardiomyopathy) [facility])     Past Surgical History:  Procedure Laterality Date  . CESAREAN SECTION    . CHOLECYSTECTOMY    . ESOPHAGOGASTRODUODENOSCOPY N/A 05/08/2017   Procedure: ESOPHAGOGASTRODUODENOSCOPY (EGD);  Surgeon: Joyce Fredrickson, MD;  Location: [facility];  Service: Endoscopy;  Laterality: N/A;  . KNEE SURGERY Right 1988  . WISDOM TOOTH EXTRACTION Bilateral 1990     Social History:  The patient  reports that she has been smoking  Cigarettes.  She has never used smokeless tobacco. She reports that she drinks about 3.0 oz of alcohol per week . She reports that she does not use drugs.   Family History:  The patient's family history includes Cancer in her maternal grandfather; Esophageal cancer in her paternal grandfather; Hypertension in her mother; Polycythemia in her father; Uterine cancer in her maternal grandmother.    ROS:  Please see the history of present illness. All other systems are reviewed and  Negative to the above problem except as noted.    PHYSICAL EXAM: VS:  BP (!) 144/100   Pulse 90   Ht 5' 2.5" (1.588 Joyce)   Wt 72.5 kg (159 lb 12.8 oz)   SpO2 90%   BMI 28.76 kg/Joyce   Pt admits she is anxious GEN: Well nourished, well developed, in no acute distress  HEENT: normal  Neck: no JVD, carotid bruits, or masses Cardiac: RRR; no murmurs, rubs, or gallops,no edema  Respiratory:  clear to auscultation  bilaterally, normal work of breathing GI: soft, nontender, nondistended, + BS  No hepatomegaly  MS: no deformity Moving all extremities   Skin: warm and dry, no rash Neuro:  Strength and sensation are intact Psych: euthymic mood, full affect   EKG:  EKG is ordered today.  SR 90 bpm     Lipid Panel    Component Value Date/Time   CHOL 356 (H) 06/02/2017 0951   TRIG (H) 06/02/2017 0951    2319.0 Triglyceride is over 400; calculations on Lipids are invalid.   HDL 43.10 06/02/2017 0951   CHOLHDL 8 06/02/2017 0951   VLDL UNABLE TO CALCULATE IF TRIGLYCERIDE OVER 400 mg/dL 35/57/3220 2542   LDLCALC UNABLE TO CALCULATE IF TRIGLYCERIDE OVER 400 mg/dL 70/62/3762 8315   LDLDIRECT 75.0 06/02/2017 0951      Wt Readings from Last 3 Encounters:  06/05/17 72.5 kg (159 lb 12.8 oz)  06/02/17 73 kg (161 lb)  05/08/17 74.8 kg (165 lb)      ASSESSMENT AND PLAN:  1  Hypertriglyceridemia  Profound  I have reviewed with Joyce Cline  Would recomm stopping niacin.  Start 4 g fish oil Retry fenofibrate 160  Reassess lipids  Consider retrial of statin  Continue to watch diet   Get [facility] from [facility]   2  Hx NICM  By pt's report, LV function has normalized Will review records  No changes in meds for now.    3  HTN  BP is up   She says she is anxious, that it is usually not this high  FOllow for now  3  Hx pancreatits with pseudocyst    Current medicines are reviewed at length with the patient today.  The patient does not have concerns regarding medicines.  Signed, Joyce Pates, MD  06/05/2017 1:31 PM    [facility] [address] [place], [place], [place]  [address] Phone: [phone]; Fax: [phone]

## 2017-06-05 NOTE — Telephone Encounter (Signed)
Follow Up:    Pt just saw Dr Tenny Craw today.She told her to get some fish oil,she wants to know what kind she should get?

## 2017-06-05 NOTE — Telephone Encounter (Signed)
Spoke with pt and she wanted to make sure that there was not a certain kind/brand of fish oil that she needed to pick up.  Advised pt as long as it is the 1000mg /1g, it doesn't matter the brand.  Pt verbalized understanding and was appreciative for call.

## 2017-06-12 ENCOUNTER — Telehealth: Payer: Self-pay | Admitting: Physician Assistant

## 2017-06-12 NOTE — Telephone Encounter (Signed)
ROI fax to [facility]

## 2017-06-30 ENCOUNTER — Other Ambulatory Visit: Payer: Self-pay | Admitting: General Surgery

## 2017-06-30 DIAGNOSIS — K861 Other chronic pancreatitis: Secondary | ICD-10-CM

## 2017-07-02 ENCOUNTER — Encounter: Payer: Self-pay | Admitting: Physician Assistant

## 2017-07-02 LAB — TRIGLYCERIDES: TRIGLYCERIDES: 2318

## 2017-07-07 ENCOUNTER — Ambulatory Visit (INDEPENDENT_AMBULATORY_CARE_PROVIDER_SITE_OTHER): Payer: No Typology Code available for payment source | Admitting: Internal Medicine

## 2017-07-07 ENCOUNTER — Encounter: Payer: Self-pay | Admitting: Internal Medicine

## 2017-07-07 VITALS — BP 104/70 | HR 80 | Ht 62.25 in | Wt 159.0 lb

## 2017-07-07 DIAGNOSIS — K859 Acute pancreatitis without necrosis or infection, unspecified: Secondary | ICD-10-CM

## 2017-07-07 DIAGNOSIS — R935 Abnormal findings on diagnostic imaging of other abdominal regions, including retroperitoneum: Secondary | ICD-10-CM

## 2017-07-07 DIAGNOSIS — R1013 Epigastric pain: Secondary | ICD-10-CM | POA: Diagnosis not present

## 2017-07-07 DIAGNOSIS — K8689 Other specified diseases of pancreas: Secondary | ICD-10-CM | POA: Diagnosis not present

## 2017-07-07 DIAGNOSIS — E781 Pure hyperglyceridemia: Secondary | ICD-10-CM

## 2017-07-07 DIAGNOSIS — K863 Pseudocyst of pancreas: Secondary | ICD-10-CM

## 2017-07-07 NOTE — Patient Instructions (Addendum)
Continue Creon - increase if necessary  Low fat diet  Discontinue alcohol use  Contact your PCP  You will be due for a colonoscopy at 48

## 2017-07-07 NOTE — Progress Notes (Signed)
HISTORY OF PRESENT ILLNESS:  Joyce Cline is a 46 y.o. female with a history of hyperlipidemia, pancreatitis, pancreatic insufficiency, and spontaneous splenic hemorrhage all evaluated elsewhere ([place]). Patient now lives [place] to be close to her daughter who is attending college. Patient is sent today for posthospitalization follow-up request of her primary care provider [name] PA. I saw the briefly on 05/08/2017 when she was hospitalized with abdominal pain. Blood work at that time including lipase was unremarkable. CT scan revealed a 7.6 x 7.1 x 4.4 cm mass adjacent to or involving the stomach in the region of the lesser sac. There was a question of gastric mass for which endoscopy was requested. Endoscopy was performed that day and found to be normal. Surgical consultation obtained. She was felt to have an inflammatory process likely from previous pancreatitis. She was treated conservatively and discharged home. She has since had surgical follow-up last week. Follow-up imaging revealed improvement in the size of the inflammatory mass now measuring 4.2 x 1.7 cm. She has planned follow-up for imaging tomorrow. She continues to have some difficulty with intermittent abdominal discomfort, though less. She drinks 2 alcoholic beverages daily. She has not had her hyperlipidemia addressed. She was having symptoms of steatorrhea for which her Creon has been increased. This helps. She tells me that she has had prior colonoscopy in [place] about 6 years ago which was normal. She is status post cholecystectomy.  REVIEW OF SYSTEMS:  All non-GI ROS negative except for visual change, fatigue, night sweats  Past Medical History:  Diagnosis Date  . Anemia   . Anxiety   . Chronic pancreatitis (HCC)   . History of blood transfusion   . HLD (hyperlipidemia)   . Hypertension   . NICM (nonischemic cardiomyopathy) (HCC)   . Splenic hemorrhage   . Viral myocarditis     Past  Surgical History:  Procedure Laterality Date  . CESAREAN SECTION    . CHOLECYSTECTOMY    . ESOPHAGOGASTRODUODENOSCOPY N/A 05/08/2017   Procedure: ESOPHAGOGASTRODUODENOSCOPY (EGD);  Surgeon: Hilarie Fredrickson, MD;  Location: [facility];  Service: Endoscopy;  Laterality: N/A;  . KNEE SURGERY Right 1988  . WISDOM TOOTH EXTRACTION Bilateral 1990    Social History Joyce Cline  reports that she has been smoking Cigarettes.  She has never used smokeless tobacco. She reports that she drinks about 3.0 oz of alcohol per week . She reports that she does not use drugs.  family history includes Cancer in her maternal grandfather; Diabetes in her paternal grandmother; Esophageal cancer in her paternal grandfather; Hypertension in her mother; Polycythemia in her father; Uterine cancer in her maternal grandmother.  No Known Allergies     PHYSICAL EXAMINATION: Vital signs: BP 104/70 (BP Location: Left Arm, Patient Position: Sitting, Cuff Size: Normal)   Pulse 80   Ht 5' 2.25" (1.581 m) Comment: height measured without shoes  Wt 159 lb (72.1 kg)   BMI 28.85 kg/m   Constitutional: generally well-appearing, no acute distress Psychiatric: alert and oriented x3, cooperative Eyes: extraocular movements intact, anicteric, conjunctiva pink Mouth: oral pharynx moist, no lesions Neck: supple no lymphadenopathy Cardiovascular: heart regular rate and rhythm, no murmur Lungs: clear to auscultation bilaterally Abdomen: soft, Tenderness in the epigastric region with mild palpation, nondistended, no obvious ascites, no peritoneal signs, normal bowel sounds, no organomegaly Rectal:Omitted Extremities: no clubbing cyanosis or lower extremity edema bilaterally Skin: no lesions on visible extremities Neuro: No focal deficits. Cranial nerves intact  ASSESSMENT:  #1. Organized fluid  collection between the pancreas and stomach. Likely inflammatory process given history of pancreatitis. The collection  has improved on serial imaging. Problems with pain has also improved  #2. Recurrent pancreatitis presumably secondary to elevated triglycerides. However, she uses alcohol regularly. She tells me that she was never told to avoid alcohol #3. History of steatorrhea. On Creon   PLAN:  #1. Low fat diet #2. Told to discontinue alcohol as this can cause pancreatitis #3. Creon with meals. Advised to increase dosage to eliminate steatorrhea. She understands #4. Advised to contact her PCP today regarding treatment of her triglycerides. She agrees #5. Routine colonoscopy would be due around age 1. She understands #6. Continue clinical and radiographic follow-up with general surgery regarding the organized fluid collection. Hopefully this will continue to resolve or significantly improved. However, if it remained of significant size, persistent, and was felt to result clinical symptoms then consideration could be made for referral to tertiary care center for therapeutic endoscopic cyst drainage at the appropriate time

## 2017-07-08 ENCOUNTER — Ambulatory Visit
Admission: RE | Admit: 2017-07-08 | Discharge: 2017-07-08 | Disposition: A | Discharge status: Home / Self Care | Payer: No Typology Code available for payment source | Source: Ambulatory Visit | Attending: General Surgery | Admitting: General Surgery

## 2017-07-08 DIAGNOSIS — K861 Other chronic pancreatitis: Secondary | ICD-10-CM

## 2017-07-08 MED ORDER — IOPAMIDOL (ISOVUE-300) INJECTION 61%
100.0000 mL | Freq: Once | INTRAVENOUS | Status: AC | PRN
  Administered 2017-07-08: 100 mL via INTRAVENOUS

## 2017-08-28 IMAGING — CT CT ABDOMEN WO/W CM
4 of 10 series · 15 of 36 positions shown, 19 images · IV contrast (WATER & [ID] ISOVUE 300)
Comparison: None.

CLINICAL DATA: Chronic recurrent pancreatitis.

EXAM:
CT ABDOMEN WITHOUT AND WITH CONTRAST
TECHNIQUE: Multidetector CT imaging of the abdomen was performed following the
standard protocol before and following the bolus administration of
intravenous contrast.
CONTRAST:  100mL EYR7F4-U11 IOPAMIDOL (EYR7F4-U11) INJECTION 61%

[Series 6: arterial 2.5 · axial · arterial · 0.80mm/px · z∈[-180,-100]mm · 2 of 97 slices shown]
[im 33/97  soft-tissue]
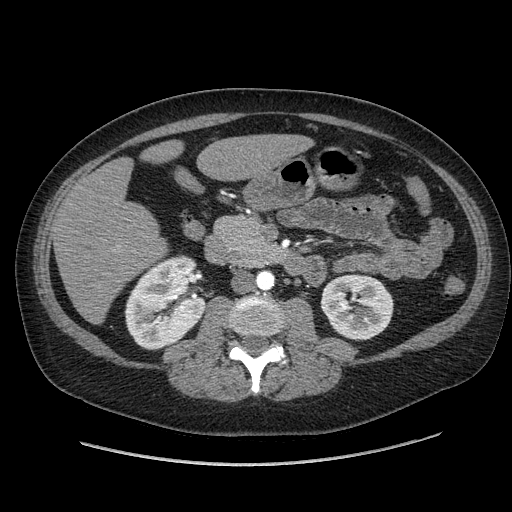
[im 65/97  soft-tissue]
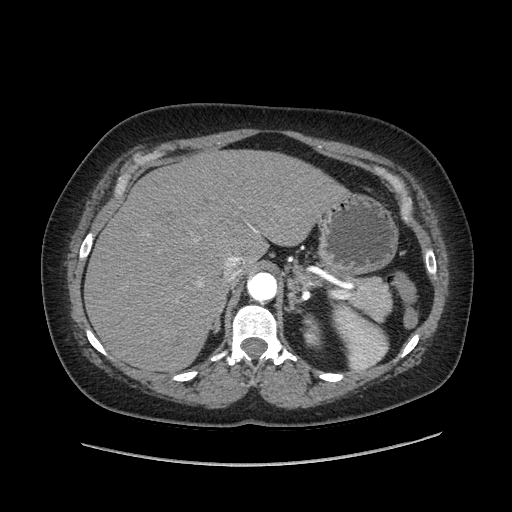

[Series 9: portal venous (id) · axial · portal-venous · 0.80mm/px · z∈[-200,-80]mm · 3 of 97 slices shown, 7 images]
[im 25/97  soft-tissue]
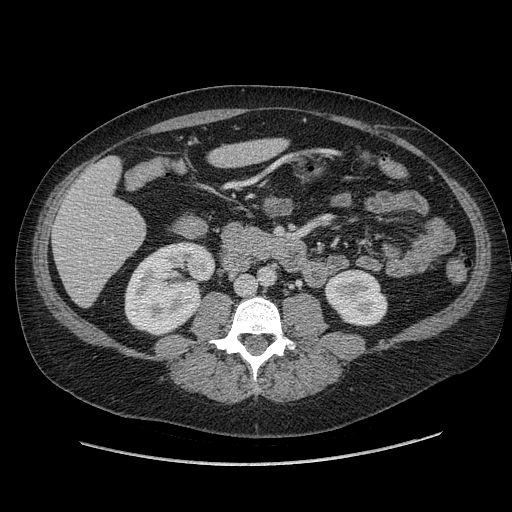
[im 25/97  lung]
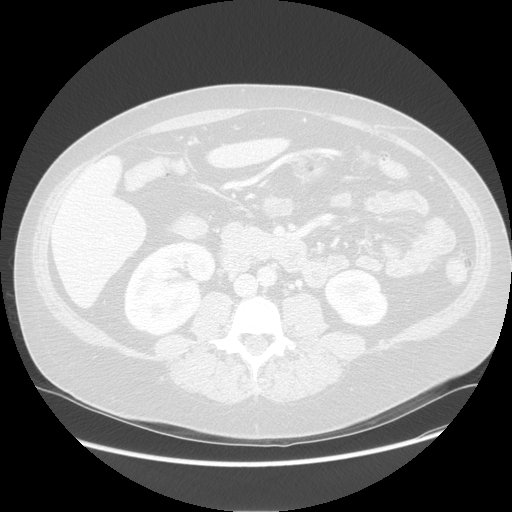
[im 25/97  bone]
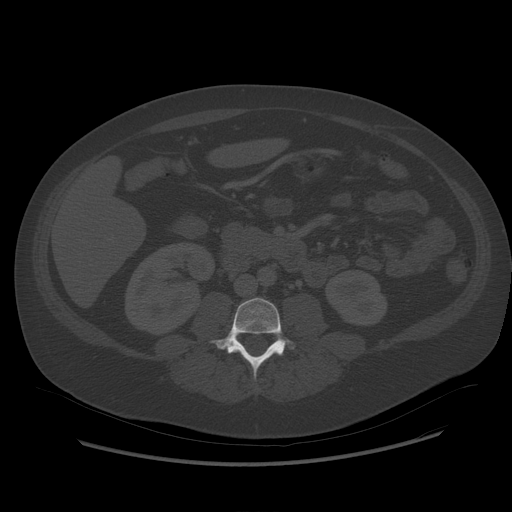
[im 49/97  soft-tissue]
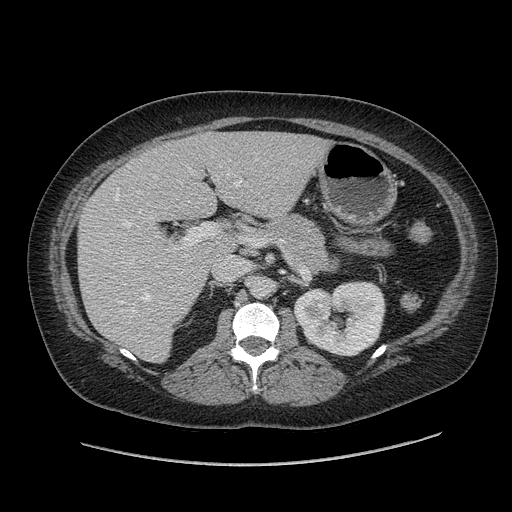
[im 49/97  lung]
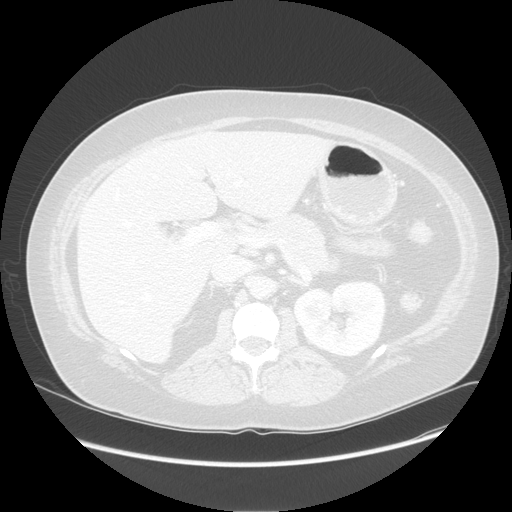
[im 73/97  soft-tissue]
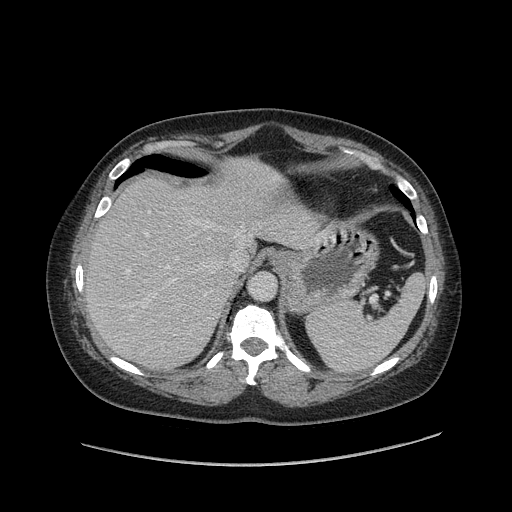
[im 73/97  lung]
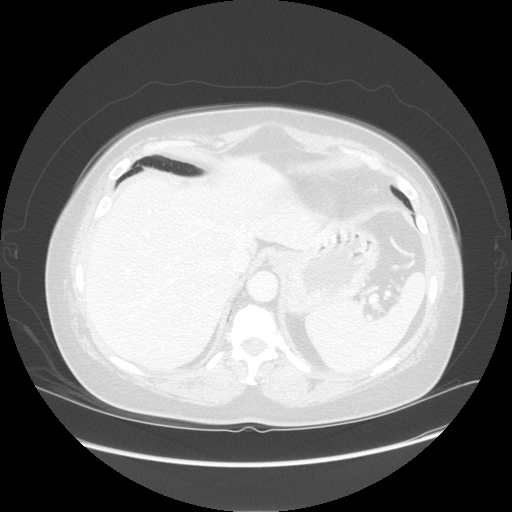

[Series 602: sagittal body · sagittal · 0.80mm/px · 6 of 162 slices shown (1 of 2)]
[im 24/162  soft-tissue]
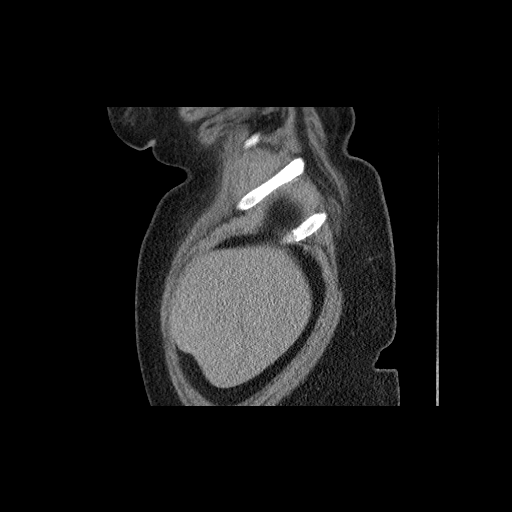
[im 47/162  soft-tissue]
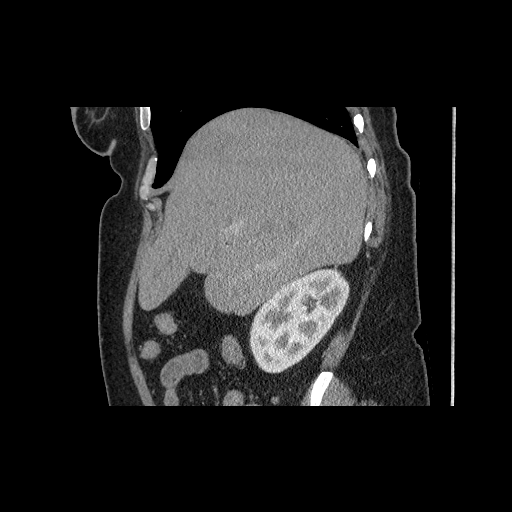
[im 70/162  soft-tissue]
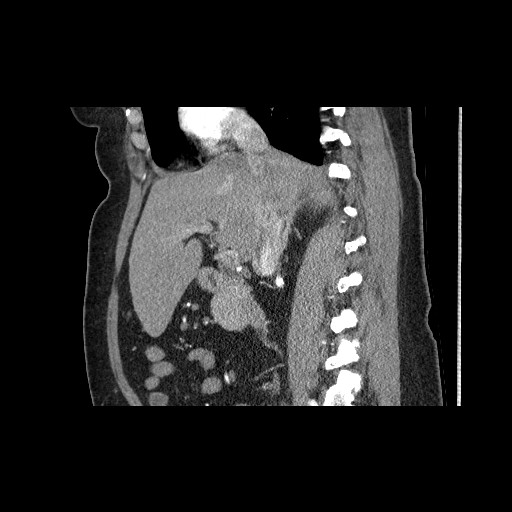
[im 93/162  soft-tissue]
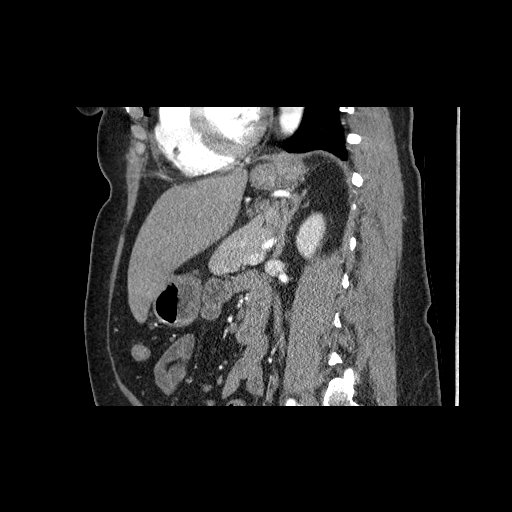
[im 116/162  soft-tissue]
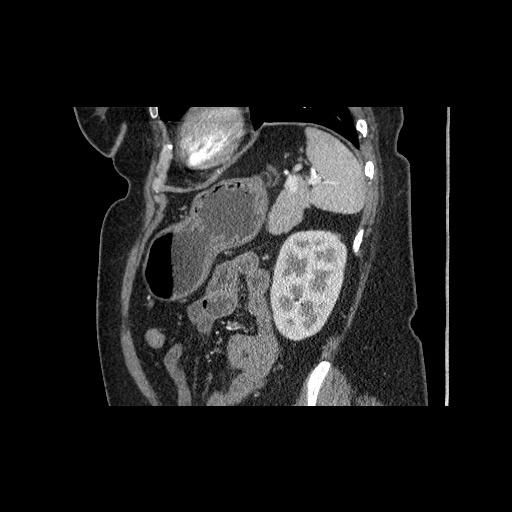
[im 139/162  soft-tissue]
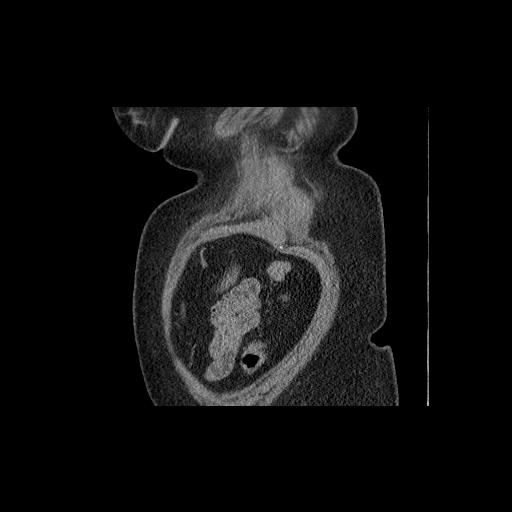

[Series 605: sagittal body · sagittal · 0.80mm/px · 4 of 161 slices shown (2 of 2)]
[im 23/161  soft-tissue]
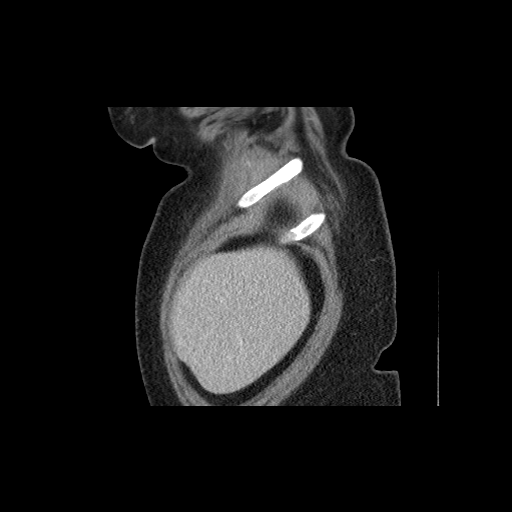
[im 46/161  soft-tissue]
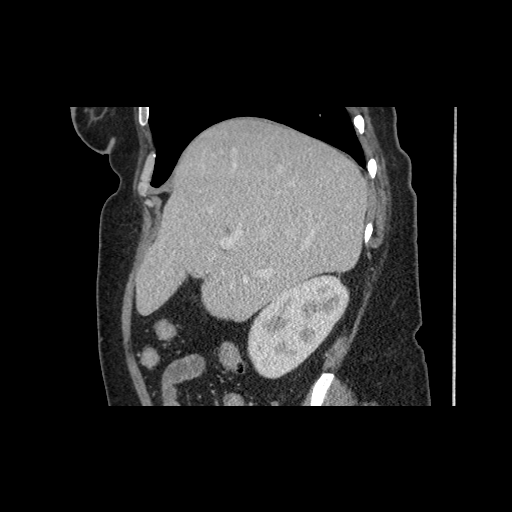
[im 69/161  soft-tissue]
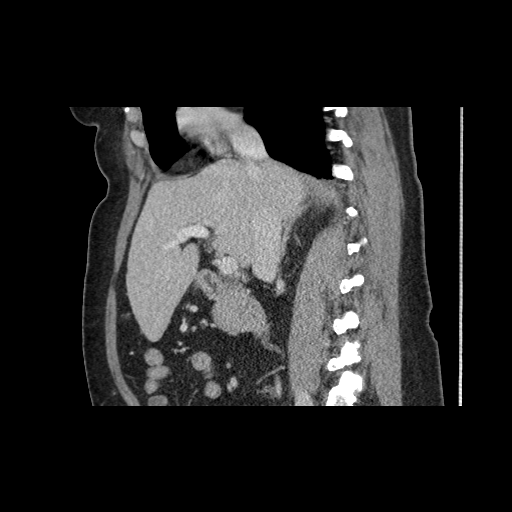
[im 92/161  soft-tissue]
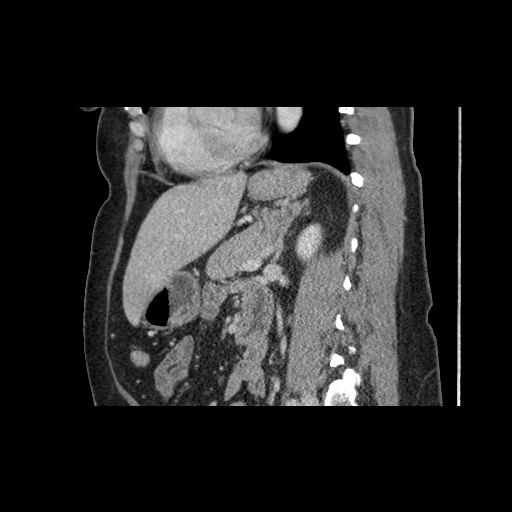

[15 of 36 positions shown; findings below may reference images not displayed]

FINDINGS: Lower chest: No acute findings.

Hepatobiliary: No masses identified. Prior cholecystectomy. No
evidence of biliary dilatation.

Pancreas: Normal appearance. No mass or inflammatory changes. No
evidence of pancreatic calcifications or ductal dilatation. No
peripancreatic fluid collections.

Spleen:  Within normal limits in size and appearance.

Adrenals/Urinary Tract: No masses identified. No evidence of
hydronephrosis.

Stomach/Bowel: Visualized portions within the abdomen are
unremarkable. Colonic diverticulosis, without radiographic evidence
of diverticulitis.

Vascular/Lymphatic: No pathologically enlarged lymph nodes
identified. No abdominal aortic aneurysm. Aortic atherosclerosis.

Other:  None.

Musculoskeletal:  No suspicious bone lesions identified.
IMPRESSION: Normal appearance of pancreas. No radiographic findings of acute or
chronic pancreatitis.

Prior cholecystectomy.  No evidence of biliary ductal dilatation.

Colonic diverticulosis, without radiographic evidence of
diverticulitis.

Aortic atherosclerosis.

## 2017-09-02 ENCOUNTER — Encounter: Payer: Self-pay | Admitting: Physician Assistant

## 2017-09-02 ENCOUNTER — Ambulatory Visit (INDEPENDENT_AMBULATORY_CARE_PROVIDER_SITE_OTHER): Payer: No Typology Code available for payment source | Admitting: Physician Assistant

## 2017-09-02 VITALS — BP 138/84 | HR 76 | Temp 98.1°F | Ht 62.25 in | Wt 164.0 lb

## 2017-09-02 DIAGNOSIS — Z23 Encounter for immunization: Secondary | ICD-10-CM | POA: Diagnosis not present

## 2017-09-02 DIAGNOSIS — E781 Pure hyperglyceridemia: Secondary | ICD-10-CM | POA: Diagnosis not present

## 2017-09-02 DIAGNOSIS — K861 Other chronic pancreatitis: Secondary | ICD-10-CM

## 2017-09-02 LAB — CBC WITH DIFFERENTIAL/PLATELET
BASOS PCT: 0.7 % (ref 0.0–3.0)
Basophils Absolute: 0 10*3/uL (ref 0.0–0.1)
EOS ABS: 0.2 10*3/uL (ref 0.0–0.7)
EOS PCT: 2.5 % (ref 0.0–5.0)
HCT: 37.7 % (ref 36.0–46.0)
HEMOGLOBIN: 12.7 g/dL (ref 12.0–15.0)
Lymphocytes Relative: 34.5 % (ref 12.0–46.0)
Lymphs Abs: 2.2 10*3/uL (ref 0.7–4.0)
MCHC: 33.6 g/dL (ref 30.0–36.0)
MCV: 99.3 fl (ref 78.0–100.0)
MONO ABS: 0.4 10*3/uL (ref 0.1–1.0)
Monocytes Relative: 6.3 % (ref 3.0–12.0)
NEUTROS ABS: 3.5 10*3/uL (ref 1.4–7.7)
Neutrophils Relative %: 56 % (ref 43.0–77.0)
Platelets: 337 10*3/uL (ref 150.0–400.0)
RBC: 3.8 Mil/uL — ABNORMAL LOW (ref 3.87–5.11)
RDW: 13.1 % (ref 11.5–15.5)
WBC: 6.3 10*3/uL (ref 4.0–10.5)

## 2017-09-02 LAB — COMPREHENSIVE METABOLIC PANEL
ALBUMIN: 4.6 g/dL (ref 3.5–5.2)
ALT: 17 U/L (ref 0–35)
AST: 16 U/L (ref 0–37)
Alkaline Phosphatase: 39 U/L (ref 39–117)
BUN: 9 mg/dL (ref 6–23)
CHLORIDE: 107 meq/L (ref 96–112)
CO2: 21 meq/L (ref 19–32)
CREATININE: 0.52 mg/dL (ref 0.40–1.20)
Calcium: 10 mg/dL (ref 8.4–10.5)
GFR: 133.81 mL/min (ref >60.00)
Glucose, Bld: 110 mg/dL — ABNORMAL HIGH (ref 70–99)
POTASSIUM: 3.9 meq/L (ref 3.5–5.1)
SODIUM: 139 meq/L (ref 135–145)
Total Bilirubin: 0.7 mg/dL (ref 0.2–1.2)
Total Protein: 7.1 g/dL (ref 6.0–8.3)

## 2017-09-02 LAB — LIPID PANEL
CHOLESTEROL: 287 mg/dL — AB (ref 0–200)
HDL: 50.8 mg/dL (ref >39.00)
Total CHOL/HDL Ratio: 6

## 2017-09-02 LAB — LIPASE: LIPASE: 31 U/L (ref 11.0–59.0)

## 2017-09-02 LAB — LDL CHOLESTEROL, DIRECT: LDL DIRECT: 134 mg/dL

## 2017-09-02 MED ORDER — TRAMADOL HCL 50 MG PO TABS: 50.0000 mg | ORAL_TABLET | Freq: Four times a day (QID) | ORAL | 0 refills | Status: AC | PRN

## 2017-09-02 NOTE — Progress Notes (Signed)
Joyce Cline is a 46 y.o. female is here to discuss: Pancreatitis.  I acted as a Neurosurgeon for [employer], PA-C Corky Mull, LPN  History of Present Illness:   Chief Complaint  Patient presents with  . Pancreatitis    Needs Endo referral    HPI  Pancreatitis Pt here to follow up on pancreatitis, saw Dr. Marina Goodell on 07/07/2017. Was told to stop all ETOH intake, and have follow-up of CT scan to assess for pancreatic pseudocyst. On 7/11, CT revealed normal appearance of pancreas. She has no scheduled follow-up with GI. She is presently stable, abdominal pain off and on. No nausea or vomiting. Less steatorrhea with increased Creon intake of 36,000 units -- 2 capsules TID. Continues to drink 1.5 glasses of wine daily. Referral to endocrinology has not happened, and she is interested in this. She takes tramadol prn for her pain. She is currently out and would like a refill.  Hypertriglyceridemia Saw Dr. Tenny Craw with cardiology on 06/05/2017 and was started on Fenofibrate 160 mg, told to stop niacin and work up to 4 grams of fish oil daily, which she has done. No repeat labs have been ordered. She continues to follow a mostly vegan diet. She is fasting today.   There are no preventive care reminders to display for this patient.  Past Medical History:  Diagnosis Date  . Anemia   . Anxiety   . Chronic pancreatitis (HCC)   . History of blood transfusion   . HLD (hyperlipidemia)   . Hypertension   . NICM (nonischemic cardiomyopathy) (HCC)   . Splenic hemorrhage   . Viral myocarditis      Social History   Social History  . Marital status: Single    Spouse name: N/A  . Number of children: 2  . Years of education: N/A   Occupational History  . customer experience    Social History Main Topics  . Smoking status: Current Every Day Smoker    Types: Cigarettes  . Smokeless tobacco: Never Used     Comment: 2 per day  . Alcohol use 3.0 oz/week    5 Glasses of wine per  week     Comment: 1-2 per day  . Drug use: No  . Sexual activity: Yes    Birth control/ protection: IUD   Other Topics Concern  . Not on file   Social History Narrative   Moved here about 6 months ago   Daughter lives here   Work -- Engineer, drilling for [employer]    Past Surgical History:  Procedure Laterality Date  . CESAREAN SECTION    . CHOLECYSTECTOMY    . ESOPHAGOGASTRODUODENOSCOPY N/A 05/08/2017   Procedure: ESOPHAGOGASTRODUODENOSCOPY (EGD);  Surgeon: Hilarie Fredrickson, MD;  Location: [facility];  Service: Endoscopy;  Laterality: N/A;  . KNEE SURGERY Right 1988  . WISDOM TOOTH EXTRACTION Bilateral 1990    Family History  Problem Relation Age of Onset  . Uterine cancer Maternal Grandmother   . Hypertension Mother   . Polycythemia Father   . Cancer Maternal Grandfather        type unknown  . Diabetes Paternal Grandmother   . Esophageal cancer Paternal Grandfather     PMHx, SurgHx, SocialHx, FamHx, Medications, and Allergies were reviewed in the Visit Navigator and updated as appropriate.   Patient Active Problem List   Diagnosis Date Noted  . Hypertension 06/02/2017  . Chronic pancreatitis (HCC) 06/02/2017  . B12 deficiency 06/02/2017  . 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 Abnormal CT of the abdomen   . Anemia, chronic disease   . Gastric mass   . Abdominal pain 05/07/2017  . Hypertriglyceridemia 05/07/2017  . NICM (nonischemic cardiomyopathy) (HCC) 05/07/2017    Social History  Substance Use Topics  . Smoking status: Current Every Day Smoker    Types: Cigarettes  . Smokeless tobacco: Never Used     Comment: 2 per day  . Alcohol use 3.0 oz/week    5 Glasses of wine per week     Comment: 1-2 per day    Current Medications and Allergies:    Current Outpatient Prescriptions:  .  amLODipine (NORVASC) 5 MG tablet, Take 1 tablet (5 mg total) by mouth daily., Disp: 90 tablet, Rfl: 1 .  carvedilol (COREG) 25 MG tablet, Take 1 tablet (25  mg total) by mouth 2 (two) times daily., Disp: 90 tablet, Rfl: 1 .  CREON 36000 units CPEP capsule, Take 2 capsules by mouth 3 (three) times daily., Disp: , Rfl: 11 .  Cyanocobalamin (VITAMIN B-12) 6000 MCG SUBL, Place 6,000 mcg under the tongue daily., Disp: , Rfl:  .  fenofibrate 160 MG tablet, Take 1 tablet (160 mg total) by mouth daily., Disp: 90 tablet, Rfl: 3 .  ibuprofen (ADVIL,MOTRIN) 200 MG tablet, Take 600 mg by mouth every 6 (six) hours as needed for headache, mild pain or moderate pain., Disp: , Rfl:  .  Multiple Vitamin (MULTIVITAMIN WITH MINERALS) TABS tablet, Take 1 tablet by mouth daily., Disp: , Rfl:  .  Omega-3 Fatty Acids (FISH OIL CONCENTRATE) 1000 MG CAPS, Take 2 capsules (2,000 mg total) by mouth 2 (two) times daily., Disp: , Rfl: 0 .  traMADol (ULTRAM) 50 MG tablet, Take 1 tablet (50 mg total) by mouth every 6 (six) hours as needed for moderate pain., Disp: 30 tablet, Rfl: 0 .  Turmeric 500 MG CAPS, Take 500 mg by mouth 2 (two) times daily., Disp: , Rfl:   No Known Allergies  Review of Systems   Review of Systems  Constitutional: Negative for chills, fever, malaise/fatigue and weight loss.  Respiratory: Negative for shortness of breath.   Cardiovascular: Negative for chest pain, orthopnea, claudication and leg swelling.  Gastrointestinal: Positive for abdominal pain (intermittent, mild). Negative for heartburn, nausea and vomiting.  Neurological: Negative for dizziness, tingling and headaches.  Psychiatric/Behavioral: Negative for depression. The patient is not nervous/anxious.     Vitals:   Vitals:   09/02/17 0819  BP: 138/84  Pulse: 76  Temp: 98.1 F (36.7 C)  TempSrc: Oral  SpO2: 98%  Weight: 164 lb (74.4 kg)  Height: 5' 2.25" (1.581 m)     Body mass index is 29.76 kg/m.   Physical Exam:    Physical Exam  Constitutional: She appears well-developed. She is cooperative.  Non-toxic appearance. She does not have a sickly appearance. She does not appear  ill. No distress.  Cardiovascular: Normal rate, regular rhythm, S1 normal, S2 normal, normal heart sounds and normal pulses.   No LE edema  Pulmonary/Chest: Effort normal and breath sounds normal.  Abdominal: Normal appearance and bowel sounds are normal. She exhibits no distension. There is generalized tenderness and tenderness in the epigastric area. There is no rigidity, no rebound and no guarding.  Neurological: She is alert. GCS eye subscore is 4. GCS verbal subscore is 5. GCS motor subscore is 6.  Skin: Skin is warm, dry and intact.  Psychiatric: She has a normal mood and affect. Her speech is  normal and behavior is normal.  Nursing note and vitals reviewed.    Assessment and Plan:    Joyce Cline was seen today for pancreatitis.  Diagnoses and all orders for this visit:  Chronic pancreatitis, unspecified pancreatitis type [facility]) CT scan on 07/08/17 showed resolved pancreatitis/collection. Continue Creon. I will recheck CMP, CBC, lipid panel and lipase today. Follow-up with Dr. Marina Goodell - I will reach out to him to make sure that this is appropriate as she was under the impression that he told her he does not treat pancreatitis. She would also like her hormones checked and wants to see an endocrinologist -- so I have put this in for her. I have also given her Tramadol 30# to use prn for abdominal pain with her pancreas. She signed a controlled substance contract today. Review of [place] Controlled Substance Database does not show any substances, however I am unable to check [facility] records. -     Comprehensive metabolic panel -     CBC with Differential/Platelet -     Lipid panel -     Ambulatory referral to Endocrinology -     Lipase  Hypertriglyceridemia Continue Fenofibrate 160 mg and 4 g fish oil daily. Will adjust prn based on lab results. Will reach out to Dr. Tenny Craw if needed for guidance on lab results. -     Lipid panel -     Ambulatory referral to Endocrinology -     Lipase  Need for  prophylactic vaccination and inoculation against influenza -     Flu Vaccine QUAD 36+ mos IM  Other orders -     traMADol (ULTRAM) 50 MG tablet; Take 1 tablet (50 mg total) by mouth every 6 (six) hours as needed for moderate pain.    . Reviewed expectations re: course of current medical issues. . Discussed self-management of symptoms. . Outlined signs and symptoms indicating need for more acute intervention. . Patient verbalized understanding and all questions were answered. . See orders for this visit as documented in the electronic medical record. . Patient received an After Visit Summary.  CMA or LPN served as scribe during this visit. History, Physical, and Plan performed by medical provider. Documentation and orders reviewed and attested to.  [name], PA-C [place] [place] 09/02/2017  Follow-up: No Follow-up on file.

## 2017-09-02 NOTE — Patient Instructions (Signed)
It was great to see you!  Let's follow-up in 3 months.  I will reach out to Dr. Marina Goodell to get you back on their schedule for routine follow-up of your pancreatitis.  I will also put in a referral for you to see endocrinology. Contact our office if you haven't heard anything within 2 weeks.

## 2017-09-03 ENCOUNTER — Telehealth: Payer: Self-pay | Admitting: Physician Assistant

## 2017-09-03 ENCOUNTER — Other Ambulatory Visit: Payer: Self-pay | Admitting: Physician Assistant

## 2017-09-03 MED ORDER — PRAVASTATIN SODIUM 10 MG PO TABS: 10.0000 mg | ORAL_TABLET | Freq: Every day | ORAL | 0 refills | Status: DC

## 2017-09-03 NOTE — Telephone Encounter (Signed)
Patient returning missed call. ° °Ty, °-LL °

## 2017-09-03 NOTE — Telephone Encounter (Signed)
See result note.  

## 2017-09-07 ENCOUNTER — Other Ambulatory Visit: Payer: Self-pay | Admitting: Physician Assistant

## 2017-10-07 ENCOUNTER — Other Ambulatory Visit: Payer: Self-pay | Admitting: *Deleted

## 2017-10-07 MED ORDER — CARVEDILOL 25 MG PO TABS: 25.0000 mg | ORAL_TABLET | Freq: Two times a day (BID) | ORAL | 0 refills | Status: DC

## 2017-10-13 ENCOUNTER — Ambulatory Visit (INDEPENDENT_AMBULATORY_CARE_PROVIDER_SITE_OTHER): Payer: No Typology Code available for payment source | Admitting: Endocrinology

## 2017-10-13 ENCOUNTER — Encounter: Payer: Self-pay | Admitting: Endocrinology

## 2017-10-13 VITALS — BP 118/82 | HR 73 | Wt 165.8 lb

## 2017-10-13 DIAGNOSIS — E781 Pure hyperglyceridemia: Secondary | ICD-10-CM

## 2017-10-13 MED ORDER — PIOGLITAZONE HCL 45 MG PO TABS: 45.0000 mg | ORAL_TABLET | Freq: Every day | ORAL | 3 refills | Status: DC

## 2017-10-13 NOTE — Patient Instructions (Addendum)
Please see a dietician specialist.  you will receive a phone call, about a day and time for an appointment.  I have sent a prescription to your pharmacy, to add "pioglitizone." Please redo the blood test in 1 month, fasting. If necessary, we can add another prescription fish oil product.   Please come back for a follow-up appointment in 6 months.

## 2017-10-13 NOTE — Progress Notes (Signed)
Subjective:    Patient ID: Joyce Cline, female    DOB: 07/12/1971, 46 y.o.   MRN: [id number]  HPI  Pt is referred by [name], PA, for hypertriglyceridemia.  This was dx'ed in 2013.  she denies h/o diabetes, oral contraceptives, steroids, alcoholism, HIV, SLE, HCTZ, retin-A, B-blocker, thyroid problems, or kidney disease.  She has had several episodes of pancreatitis (most recently in mid-2018, when TG were over 2000).  She was rx'ed with lipophoresis.  Since d/c, she has been on creon, non-rx fish oil (ins declined vascepa), and fenofibrate.  She has slight weight gain, and assoc arthralgias Past Medical History:  Diagnosis Date  . Anemia   . Anxiety   . Chronic pancreatitis (HCC)   . History of blood transfusion   . HLD (hyperlipidemia)   . Hypertension   . NICM (nonischemic cardiomyopathy) (HCC)   . Splenic hemorrhage   . Viral myocarditis     Past Surgical History:  Procedure Laterality Date  . CESAREAN SECTION    . CHOLECYSTECTOMY    . ESOPHAGOGASTRODUODENOSCOPY N/A 05/08/2017   Procedure: ESOPHAGOGASTRODUODENOSCOPY (EGD);  Surgeon: Hilarie Fredrickson, MD;  Location: [facility];  Service: Endoscopy;  Laterality: N/A;  . KNEE SURGERY Right 1988  . WISDOM TOOTH EXTRACTION Bilateral 1990    Social History   Social History  . Marital status: Single    Spouse name: N/A  . Number of children: 2  . Years of education: N/A   Occupational History  . customer experience    Social History Main Topics  . Smoking status: Current Every Day Smoker    Types: Cigarettes  . Smokeless tobacco: Never Used     Comment: 2 per day  . Alcohol use 3.0 oz/week    5 Glasses of wine per week     Comment: 1-2 per day  . Drug use: No  . Sexual activity: Yes    Birth control/ protection: IUD   Other Topics Concern  . Not on file   Social History Narrative   Moved here about 6 months ago   Daughter lives here   Work -- Engineer, drilling for [employer]    Current Outpatient Prescriptions on File Prior to Visit  Medication Sig Dispense Refill  . amLODipine (NORVASC) 5 MG tablet TAKE 1 TABLET BY MOUTH EVERY DAY 90 tablet 1  . carvedilol (COREG) 25 MG tablet Take 1 tablet (25 mg total) by mouth 2 (two) times daily. 180 tablet 0  . CREON 36000 units CPEP capsule Take 2 capsules by mouth 3 (three) times daily.  11  . Cyanocobalamin (VITAMIN B-12) 6000 MCG SUBL Place 2,000 mcg under the tongue daily.     . fenofibrate 160 MG tablet Take 1 tablet (160 mg total) by mouth daily. 90 tablet 3  . ibuprofen (ADVIL,MOTRIN) 200 MG tablet Take 600 mg by mouth every 6 (six) hours as needed for headache, mild pain or moderate pain.    . Multiple Vitamin (MULTIVITAMIN WITH MINERALS) TABS tablet Take 1 tablet by mouth daily.    . Omega-3 Fatty Acids (FISH OIL CONCENTRATE) 1000 MG CAPS Take 2 capsules (2,000 mg total) by mouth 2 (two) times daily.  0  . pravastatin (PRAVACHOL) 10 MG tablet Take 1 tablet (10 mg total) by mouth daily. 90 tablet 0  . traMADol (ULTRAM) 50 MG tablet Take 1 tablet (50 mg total) by mouth every 6 (six) hours as needed for moderate pain. 30 tablet 0  . Turmeric  500 MG CAPS Take 500 mg by mouth 2 (two) times daily.     No current facility-administered medications on file prior to visit.     No Known Allergies  Family History  Problem Relation Age of Onset  . Uterine cancer Maternal Grandmother   . Hypertension Mother   . Polycythemia Father   . Cancer Maternal Grandfather        type unknown  . Diabetes Paternal Grandmother   . Esophageal cancer Paternal Grandfather   . Hyperlipidemia Neg Hx     BP 118/82   Pulse 73   Wt 165 lb 12.8 oz (75.2 kg)   SpO2 98%   BMI 30.08 kg/m    Review of Systems Denies numbness, chest pain, skin rash, dyspnea, eye irritation, neck swelling, depression, rhinorrhea, headache, cold intolerance, and syncope. She has frequent urination, and chronic abd pain.     Objective:   Physical  Exam VS: see vs page GEN: no distress HEAD: head: no deformity eyes: no periorbital swelling, no proptosis external nose and ears are normal mouth: no lesion seen NECK: supple, thyroid is not enlarged CHEST WALL: no deformity LUNGS: clear to auscultation CV: reg rate and rhythm, no murmur ABD: abdomen is soft, nontender.  no hepatosplenomegaly.  not distended.  no hernia MUSCULOSKELETAL: muscle bulk and strength are grossly normal.  no obvious joint swelling.  gait is normal and steady EXTEMITIES: no deformity.  no ulcer on the feet.  feet are of normal color and temp.  no edema PULSES: dorsalis pedis intact bilat.  no carotid bruit NEURO:  cn 2-12 grossly intact.   readily moves all 4's.  sensation is intact to touch on the feet SKIN:  Normal texture and temperature.  No rash or suspicious lesion is visible.  No eruption.  NODES:  None palpable at the neck.  PSYCH: alert, well-oriented.  Does not appear anxious nor depressed.    Lab Results  Component Value Date   CHOL 287 (H) 09/02/2017   HDL 50.80 09/02/2017   LDLCALC UNABLE TO CALCULATE IF TRIGLYCERIDE OVER 400 mg/dL 19/59/7471   LDLDIRECT 134.0 09/02/2017   TRIG (H) 09/02/2017    571.0 Triglyceride is over 400; calculations on Lipids are invalid.   CHOLHDL 6 09/02/2017   I have reviewed outside records, and summarized: Pt was noted to have elevated TG, and referred here.  She was also noted to have ongoing consumption of EtOH.       Assessment & Plan:  Hypertriglyceridemia, new to me.  We can add lovaza also if necessary.    Patient Instructions  Please see a dietician specialist.  you will receive a phone call, about a day and time for an appointment.  I have sent a prescription to your pharmacy, to add "pioglitizone." Please redo the blood test in 1 month, fasting. If necessary, we can add another prescription fish oil product.   Please come back for a follow-up appointment in 6 months.

## 2017-11-11 ENCOUNTER — Other Ambulatory Visit (INDEPENDENT_AMBULATORY_CARE_PROVIDER_SITE_OTHER): Payer: No Typology Code available for payment source

## 2017-11-11 ENCOUNTER — Other Ambulatory Visit: Payer: Self-pay | Admitting: Endocrinology

## 2017-11-11 DIAGNOSIS — E781 Pure hyperglyceridemia: Secondary | ICD-10-CM | POA: Diagnosis not present

## 2017-11-11 LAB — URINALYSIS, ROUTINE W REFLEX MICROSCOPIC
BILIRUBIN URINE: NEGATIVE
HGB URINE DIPSTICK: NEGATIVE
KETONES UR: NEGATIVE
LEUKOCYTES UA: NEGATIVE
NITRITE: NEGATIVE
Specific Gravity, Urine: 1.02 (ref 1.000–1.030)
URINE GLUCOSE: NEGATIVE
Urobilinogen, UA: 1 (ref 0.0–1.0)
pH: 7 (ref 5.0–8.0)

## 2017-11-11 LAB — HEPATIC FUNCTION PANEL
ALBUMIN: 4.5 g/dL (ref 3.5–5.2)
ALK PHOS: 37 U/L — AB (ref 39–117)
ALT: 17 U/L (ref 0–35)
AST: 14 U/L (ref 0–37)
BILIRUBIN DIRECT: 0.1 mg/dL (ref 0.0–0.3)
TOTAL PROTEIN: 7.3 g/dL (ref 6.0–8.3)
Total Bilirubin: 0.7 mg/dL (ref 0.2–1.2)

## 2017-11-11 LAB — LIPID PANEL
CHOLESTEROL: 249 mg/dL — AB (ref 0–200)
HDL: 46.3 mg/dL (ref >39.00)
Total CHOL/HDL Ratio: 5

## 2017-11-11 LAB — T4, FREE: FREE T4: 0.87 ng/dL (ref 0.60–1.60)

## 2017-11-11 LAB — TSH: TSH: 0.93 u[IU]/mL (ref 0.35–4.50)

## 2017-11-11 LAB — LDL CHOLESTEROL, DIRECT: Direct LDL: 112 mg/dL

## 2017-11-11 MED ORDER — OMEGA-3-ACID ETHYL ESTERS 1 G PO CAPS: 2.0000 g | ORAL_CAPSULE | Freq: Two times a day (BID) | ORAL | 11 refills | Status: DC

## 2017-11-13 ENCOUNTER — Other Ambulatory Visit: Payer: Self-pay

## 2017-11-25 NOTE — Telephone Encounter (Signed)
No records from [facility]

## 2017-12-02 ENCOUNTER — Ambulatory Visit: Payer: Self-pay | Admitting: Physician Assistant

## 2017-12-07 ENCOUNTER — Ambulatory Visit: Payer: Self-pay | Admitting: Dietician

## 2017-12-18 ENCOUNTER — Emergency Department (HOSPITAL_COMMUNITY): Payer: No Typology Code available for payment source

## 2017-12-18 ENCOUNTER — Emergency Department (HOSPITAL_COMMUNITY)
Admission: EM | Admit: 2017-12-18 | Discharge: 2017-12-18 | Disposition: A | Discharge status: Home / Self Care | Payer: No Typology Code available for payment source | Attending: Emergency Medicine | Admitting: Emergency Medicine

## 2017-12-18 ENCOUNTER — Other Ambulatory Visit: Payer: Self-pay

## 2017-12-18 ENCOUNTER — Encounter (HOSPITAL_COMMUNITY): Payer: Self-pay | Admitting: Emergency Medicine

## 2017-12-18 DIAGNOSIS — R1032 Left lower quadrant pain: Secondary | ICD-10-CM | POA: Diagnosis present

## 2017-12-18 DIAGNOSIS — I10 Essential (primary) hypertension: Secondary | ICD-10-CM | POA: Diagnosis not present

## 2017-12-18 DIAGNOSIS — F17210 Nicotine dependence, cigarettes, uncomplicated: Secondary | ICD-10-CM | POA: Diagnosis not present

## 2017-12-18 DIAGNOSIS — Z79899 Other long term (current) drug therapy: Secondary | ICD-10-CM | POA: Diagnosis not present

## 2017-12-18 DIAGNOSIS — K5792 Diverticulitis of intestine, part unspecified, without perforation or abscess without bleeding: Secondary | ICD-10-CM | POA: Insufficient documentation

## 2017-12-18 LAB — BASIC METABOLIC PANEL
ANION GAP: 12 (ref 5–15)
BUN: 8 mg/dL (ref 6–20)
CALCIUM: 10 mg/dL (ref 8.9–10.3)
CO2: 22 mmol/L (ref 22–32)
Chloride: 104 mmol/L (ref 101–111)
Creatinine, Ser: 0.44 mg/dL (ref 0.44–1.00)
GFR calc Af Amer: >60 mL/min (ref >60)
GLUCOSE: 102 mg/dL — AB (ref 65–99)
Potassium: 4 mmol/L (ref 3.5–5.1)
Sodium: 138 mmol/L (ref 135–145)

## 2017-12-18 LAB — CBC
HCT: 36.1 % (ref 36.0–46.0)
Hemoglobin: 12.2 g/dL (ref 12.0–15.0)
MCH: 33 pg (ref 26.0–34.0)
MCHC: 33.8 g/dL (ref 30.0–36.0)
MCV: 97.6 fL (ref 78.0–100.0)
PLATELETS: 353 10*3/uL (ref 150–400)
RBC: 3.7 MIL/uL — ABNORMAL LOW (ref 3.87–5.11)
RDW: 13 % (ref 11.5–15.5)
WBC: 12 10*3/uL — AB (ref 4.0–10.5)

## 2017-12-18 LAB — URINALYSIS, ROUTINE W REFLEX MICROSCOPIC
Bilirubin Urine: NEGATIVE
GLUCOSE, UA: NEGATIVE mg/dL
Hgb urine dipstick: NEGATIVE
KETONES UR: NEGATIVE mg/dL
LEUKOCYTES UA: NEGATIVE
Nitrite: NEGATIVE
PROTEIN: NEGATIVE mg/dL
Specific Gravity, Urine: 1.005 (ref 1.005–1.030)
pH: 6 (ref 5.0–8.0)

## 2017-12-18 LAB — HEPATIC FUNCTION PANEL
ALT: 23 U/L (ref 14–54)
AST: 25 U/L (ref 15–41)
Albumin: 4.2 g/dL (ref 3.5–5.0)
Alkaline Phosphatase: 45 U/L (ref 38–126)
BILIRUBIN TOTAL: 1.2 mg/dL (ref 0.3–1.2)
Total Protein: 7.6 g/dL (ref 6.5–8.1)

## 2017-12-18 LAB — I-STAT BETA HCG BLOOD, ED (MC, WL, AP ONLY)

## 2017-12-18 LAB — LIPASE, BLOOD: Lipase: 27 U/L (ref 11–51)

## 2017-12-18 MED ORDER — CIPROFLOXACIN HCL 500 MG PO TABS: 500.0000 mg | ORAL_TABLET | Freq: Two times a day (BID) | ORAL | 0 refills | Status: DC

## 2017-12-18 MED ORDER — IOPAMIDOL (ISOVUE-300) INJECTION 61%
INTRAVENOUS | Status: AC
  Administered 2017-12-18: 100 mL via INTRAVENOUS
  Filled 2017-12-18: qty 100

## 2017-12-18 MED ORDER — METRONIDAZOLE 500 MG PO TABS
500.0000 mg | ORAL_TABLET | Freq: Once | ORAL | Status: AC
  Administered 2017-12-18: 500 mg via ORAL
  Filled 2017-12-18: qty 1

## 2017-12-18 MED ORDER — CIPROFLOXACIN HCL 500 MG PO TABS
500.0000 mg | ORAL_TABLET | Freq: Once | ORAL | Status: AC
  Administered 2017-12-18: 500 mg via ORAL
  Filled 2017-12-18: qty 1

## 2017-12-18 MED ORDER — HYDROCODONE-ACETAMINOPHEN 5-325 MG PO TABS: 1.0000 | ORAL_TABLET | Freq: Four times a day (QID) | ORAL | 0 refills | Status: DC | PRN

## 2017-12-18 MED ORDER — METRONIDAZOLE 500 MG PO TABS: 500.0000 mg | ORAL_TABLET | Freq: Two times a day (BID) | ORAL | 0 refills | Status: DC

## 2017-12-18 MED ORDER — ONDANSETRON HCL 4 MG/2ML IJ SOLN
4.0000 mg | Freq: Once | INTRAMUSCULAR | Status: AC
  Administered 2017-12-18: 4 mg via INTRAVENOUS
  Filled 2017-12-18: qty 2

## 2017-12-18 MED ORDER — HYDROMORPHONE HCL 1 MG/ML IJ SOLN
1.0000 mg | Freq: Once | INTRAMUSCULAR | Status: AC
  Administered 2017-12-18: 1 mg via INTRAVENOUS
  Filled 2017-12-18: qty 1

## 2017-12-18 NOTE — ED Triage Notes (Signed)
Pt complaint of severe left flank pain onset this am; denies GU symptoms or n/v/d.

## 2017-12-18 NOTE — ED Notes (Signed)
Unsuccessful IV attempt x2. Velna Hatchet RN at bedside to attempt.

## 2017-12-18 NOTE — ED Notes (Signed)
Pt using restroom

## 2017-12-18 NOTE — ED Provider Notes (Signed)
[facility]-EMERGENCY DEPT Provider Note   CSN: [id number] Arrival date & time: 12/18/17  1010     History   Chief Complaint Chief Complaint  Patient presents with  . Flank Pain    HPI Joyce Cline is a 46 y.o. female.  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  Patient states throughout the night she would wake up occasionally and noticed that there was some aching in her left side which was tolerable and she was able to go back to sleep.  After she arrived at work today the pain suddenly became much worse and now is 10 out of 10, sharp and shooting.  With movement seems to make the pain worse.  She denies any urinary symptoms, no nausea, vomiting or diarrhea.  Patient had a normal bowel movement this morning.  She states this is not typically feel like her pancreatitis pain and she has no prior history of kidney stones.  She denies any chest pain or shortness of breath but the pain is worse in her abdomen when she takes a deep breath.  Denies any fever.  She has not taken anything for the pain but states she does have tramadol in her purse that she would be willing to try.   The history is provided by the patient.  Flank Pain     Past Medical History:  Diagnosis Date  . Anemia   . Anxiety   . Chronic pancreatitis (HCC)   . History of blood transfusion   . HLD (hyperlipidemia)   . Hypertension   . NICM (nonischemic cardiomyopathy) (HCC)   . Splenic hemorrhage   . Viral myocarditis     Patient Active Problem List   Diagnosis Date Noted  . Hypertension 06/02/2017  . Chronic pancreatitis (HCC) 06/02/2017  . B12 deficiency 06/02/2017  . 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 Abnormal CT of the abdomen   . Anemia, chronic disease   . Gastric mass   . Abdominal pain  05/07/2017  . Hypertriglyceridemia 05/07/2017  . NICM (nonischemic cardiomyopathy) (HCC) 05/07/2017    Past Surgical History:  Procedure Laterality Date  . CESAREAN SECTION    . CHOLECYSTECTOMY    . ESOPHAGOGASTRODUODENOSCOPY N/A 05/08/2017   Procedure: ESOPHAGOGASTRODUODENOSCOPY (EGD);  Surgeon: Hilarie Fredrickson, MD;  Location: [facility];  Service: Endoscopy;  Laterality: N/A;  . KNEE SURGERY Right 1988  . WISDOM TOOTH EXTRACTION Bilateral 1990    OB History    No data available       Home Medications    Prior to Admission medications   Medication Sig Start Date End Date Taking? Authorizing Provider  amLODipine (NORVASC) 5 MG tablet TAKE 1 TABLET BY MOUTH EVERY DAY 09/07/17  Yes [name], PA  carvedilol (COREG) 25 MG tablet Take 1 tablet (25 mg total) by mouth 2 (two) times daily. 10/07/17  Yes [name], PA  CREON 36000 units CPEP capsule Take 2 capsules by mouth 3 (three) times daily. 06/29/17  Yes [provider]  Cyanocobalamin (VITAMIN B-12) 6000 MCG SUBL Place 2,000 mcg under the tongue daily.    Yes [provider]  fenofibrate 160 MG tablet Take 1 tablet (160 mg total) by mouth daily. 06/05/17  Yes Pricilla Riffle, MD  Multiple Vitamin (MULTIVITAMIN WITH MINERALS) TABS tablet Take 1 tablet by mouth daily.   Yes [provider]  omega-3 acid ethyl esters (LOVAZA) 1 g capsule Take 2 capsules (2 g total) 2 (two) times daily by mouth. 11/11/17  Yes Romero BellingEllison, Sean, MD  pravastatin (PRAVACHOL) 10 MG tablet Take 1 tablet (10 mg total) by mouth daily. 09/03/17  Yes Jarold MottoWorley, Samantha, PA  Turmeric 500 MG CAPS Take 500 mg by mouth 2 (two) times daily.   Yes [provider]  pioglitazone (ACTOS) 45 MG tablet Take 1 tablet (45 mg total) by mouth daily. Patient not taking: Reported on 12/18/2017 10/13/17   Romero BellingEllison, Sean, MD  traMADol (ULTRAM) 50 MG tablet Take 1 tablet (50 mg total) by mouth every 6 (six) hours as needed for moderate  pain. Patient not taking: Reported on 12/18/2017 09/02/17   Jarold MottoWorley, Samantha, PA    Family History Family History  Problem Relation Age of Onset  . Uterine cancer Maternal Grandmother   . Hypertension Mother   . Polycythemia Father   . Cancer Maternal Grandfather        type unknown  . Diabetes Paternal Grandmother   . Esophageal cancer Paternal Grandfather   . Hyperlipidemia Neg Hx     Social History Social History   Tobacco Use  . Smoking status: Current Every Day Smoker    Types: Cigarettes  . Smokeless tobacco: Never Used  . Tobacco comment: 2 per day  Substance Use Topics  . Alcohol use: Yes    Alcohol/week: 3.0 oz    Types: 5 Glasses of wine per week    Comment: 1-2 per day  . Drug use: No     Allergies   Patient has no known allergies.   Review of Systems Review of Systems  Genitourinary: Positive for flank pain.  All other systems reviewed and are negative.    Physical Exam Updated Vital Signs BP (!) 146/94   Pulse 84   Temp 98.3 F (36.8 C) (Oral)   Resp 16   SpO2 97%   Physical Exam  Constitutional: She is oriented to person, place, and time. She appears well-developed and well-nourished. No distress.  HENT:  Head: Normocephalic and atraumatic.  Mouth/Throat: Oropharynx is clear and moist.  Eyes: Conjunctivae and EOM are normal. Pupils are equal, round, and reactive to light.  Neck: Normal range of motion. Neck supple.  Cardiovascular: Normal rate, regular rhythm and intact distal pulses.  No murmur heard. Pulmonary/Chest: Effort normal and breath sounds normal. No respiratory distress. She has no wheezes. She has no rales.  Abdominal: Soft. She exhibits no distension. There is tenderness. There is guarding. There is no rebound and no CVA tenderness.    Musculoskeletal: Normal range of motion. She exhibits no edema or tenderness.  Neurological: She is alert and oriented to person, place, and time.  Skin: Skin is warm and dry. No rash noted.  No erythema.  Psychiatric: She has a normal mood and affect. Her behavior is normal.  Nursing note and vitals reviewed.    ED Treatments / Results  Labs (all labs ordered are listed, but only abnormal results are displayed) Labs Reviewed  CBC - Abnormal; Notable for the following components:      Result Value   WBC 12.0 (*)    RBC 3.70 (*)    All other components within normal limits  BASIC METABOLIC PANEL - Abnormal; Notable for the following components:   Glucose, Bld 102 (*)    All other components within normal limits  HEPATIC FUNCTION PANEL - Abnormal; Notable for the following components:   Bilirubin, Direct <  0.1 (*)    All other components within normal limits  URINALYSIS, ROUTINE W REFLEX MICROSCOPIC  LIPASE, BLOOD  I-STAT BETA HCG BLOOD, ED ([facility], [facility], [facility] ONLY)    EKG  EKG Interpretation None       Radiology Ct Abdomen Pelvis W Contrast  Result Date: 12/18/2017 CLINICAL DATA:  Severe left flank pain EXAM: CT ABDOMEN AND PELVIS WITH CONTRAST TECHNIQUE: Multidetector CT imaging of the abdomen and pelvis was performed using the standard protocol following bolus administration of intravenous contrast. CONTRAST:  100 mL Isovue 300 intravenous COMPARISON:  07/08/2017, 05/14/2017 FINDINGS: Lower chest: Lung bases show no acute consolidation or pleural effusion. Normal heart size. Hepatobiliary: No focal liver abnormality is seen. Status post cholecystectomy. Mildly prominent extrahepatic common bile duct likely due to post surgical change. Pancreas: Unremarkable. No pancreatic ductal dilatation or surrounding inflammatory changes. Spleen: Normal in size without focal abnormality. Adrenals/Urinary Tract: Adrenal glands are unremarkable. Kidneys are normal, without renal calculi, focal lesion, or hydronephrosis. Bladder is unremarkable. Stomach/Bowel: Stomach is nonenlarged. No dilated small bowel. Normal appendix. Focal wall thickening involving the distal descending colon with  diverticula present. Mild adjacent inflammatory changes. No extraluminal gas. Vascular/Lymphatic: Aortic atherosclerosis. No enlarged abdominal or pelvic lymph nodes. Reproductive: Intrauterine device is present.  No adnexal mass. Other: Trace free fluid in the pelvis.  Negative for free air. Musculoskeletal: No acute or significant osseous findings. IMPRESSION: 1. Mild focal wall thickening an adjacent inflammation involving the descending colon, findings felt most consistent with an acute diverticulitis. No abscess. No perforation. 2. There are no other acute abnormalities visualized. Electronically Signed   By: Jasmine Pang M.D.   On: 12/18/2017 14:41    Procedures Procedures (including critical care time)  Medications Ordered in ED Medications  HYDROmorphone (DILAUDID) injection 1 mg (1 mg Intravenous Given 12/18/17 1236)  ondansetron (ZOFRAN) injection 4 mg (4 mg Intravenous Given 12/18/17 1236)     Initial Impression / Assessment and Plan / ED Course  I have reviewed the triage vital signs and the nursing notes.  Pertinent labs & imaging results that were available during my care of the patient were reviewed by me and considered in my medical decision making (see chart for details).     Patient presenting with relatively acute onset of pain that is severe at this time located in the left side.  Pain is not classic for kidney stone type pain.  Concern for possible incarcerated hernia versus patient does have a history of pancreatitis with pseudocysts possible rupture of pseudocyst.  Lower suspicion for diverticulitis.  Also could be a perforated viscus however patient's pain is very localized.  Patient has a mild leukocytosis of 12 but normal hemoglobin, BMP with no acute findings.  Lipase and LFTs are pending.  Patient given IV fluids, pain and nausea medication.  CT of the abdomen and pelvis pending  3:08 PM CT is consistent with diverticulitis.  Patient's pain is much more controlled  after 1 dose of pain medication.  She was started on Cipro, Flagyl and pain control at home.  She does have a GI specialist she can follow-up with.  Final Clinical Impressions(s) / ED Diagnoses   Final diagnoses:  Diverticulitis    ED Discharge Orders        Ordered    ciprofloxacin (CIPRO) 500 MG tablet  2 times daily     12/18/17 1510    metroNIDAZOLE (FLAGYL) 500 MG tablet  2 times daily     12/18/17 1510  HYDROcodone-acetaminophen (NORCO/VICODIN) 5-325 MG tablet  Every 6 hours PRN     12/18/17 1510       Gwyneth Sprout, MD 12/18/17 1510

## 2017-12-18 NOTE — ED Notes (Signed)
ED Provider at bedside. 

## 2017-12-21 ENCOUNTER — Other Ambulatory Visit: Payer: Self-pay | Admitting: Physician Assistant

## 2017-12-21 MED ORDER — PRAVASTATIN SODIUM 10 MG PO TABS: 10.0000 mg | ORAL_TABLET | Freq: Every day | ORAL | 0 refills | Status: DC

## 2017-12-21 MED ORDER — CARVEDILOL 25 MG PO TABS: 25.0000 mg | ORAL_TABLET | Freq: Two times a day (BID) | ORAL | 0 refills | Status: DC

## 2017-12-25 ENCOUNTER — Telehealth: Payer: Self-pay | Admitting: Endocrinology

## 2017-12-25 ENCOUNTER — Encounter: Payer: No Typology Code available for payment source | Attending: Endocrinology | Admitting: Dietician

## 2017-12-25 DIAGNOSIS — E781 Pure hyperglyceridemia: Secondary | ICD-10-CM | POA: Insufficient documentation

## 2017-12-25 DIAGNOSIS — K861 Other chronic pancreatitis: Secondary | ICD-10-CM

## 2017-12-25 DIAGNOSIS — Z713 Dietary counseling and surveillance: Secondary | ICD-10-CM | POA: Diagnosis not present

## 2017-12-25 DIAGNOSIS — K5732 Diverticulitis of large intestine without perforation or abscess without bleeding: Secondary | ICD-10-CM

## 2017-12-25 NOTE — Telephone Encounter (Signed)
I left patient VM that labs were already placed in Epic so she could call to make appt to have them done.

## 2017-12-25 NOTE — Telephone Encounter (Signed)
Pt is in office today to see Vernona Rieger. She stated the last time she was in that we wanted her to get blood work done in a month. But she didn't set the appt up.  She wanted to know if she needed to get them done or if we can call and get her set up later for them if she needs them   Please advise. THANKS

## 2017-12-25 NOTE — Telephone Encounter (Signed)
It is already ordered in epic

## 2017-12-25 NOTE — Progress Notes (Signed)
Medical Nutrition Therapy:  Appt start time: 0915 end time:  1030.   Assessment:  Primary concerns today: Patient is here today alone.  She was referred for hypertriglyceridemia and has a history of chronic pancreatitis.  Her last hospitalization for this was mid 2018.  Her Triglycerides were 2319 in June 2018 and generally runs at about 600 and was 610 in 10/2017.   3 years ago she was admitted to the hospital for 45 days, was NPO and required TPN.  She states that her hair has been breaking since that time and is unable to grow it out.  She has been vegan for the past 3 years except for occasional fish.  Other history includes diverticulitis and she was hospitalized for this recently and is now trying to follow a low fiber, vegan diet and needs advise about this as well.  She is finishing her last dose of antibiotic for this today.   Weight hx: 130 lbs in high school.  Gained weight when working from home for 9 years.  Today's weight 167 lbs and would like to return to 150-155 lbs although sees her ultimate goal at 140 lbs but has not weighed since 2004. She tried the Zone diet in there late 30's.  She would like to balance out her carbohydrate, protein, and fat similar to this. She currently tries to get 20% of her calories from fat, 20% of her calories from protein, and 60% of her calories from Carbohydrate.  She records her intake in My Fitness Pal to work to meet her needs.  She has followed a very low fat diet for quite a while.  She eats out once per week.    Patient's mother lives with her and they recently moved to [facility] from [place] to be closer to her daughter about one year ago.  She works as a Manufacturing engineer.  Her mother does the cooking.  Preferred Learning Style:   No preference indicated   Learning Readiness:   Ready  Change in progress   MEDICATIONS: see list to include dissolvable Vitamin B-12, fenefibrate, pravastatin, omega-3, creon,  tumeric, multivitamin.   DIETARY INTAKE: Follows a vegan diet and does an occasional 2 day juice cleanse. Usual eating pattern includes 2 meals and 2 snacks per day. Avoided foods include alcohol, sweets.  Does not eat a lot of fruit.    24-hr recall:  B ( AM): [employer]  Sugar free almond milk late and occasional Bagel with avocado  Snk ( AM): raw vegetables with or without hummus L ( PM): salad (greens, tofu or beans or vegan sausage, vegetables) Snk ( PM): flavored water or baked chips and guacamole or salsa D ( PM): vegan chili OR vegeburger and tater tots or french fries (baked) Snk ( PM): none Beverages: flavored water, juices (vegetable and fruit), protein shake (skinny gut)  Usual physical activity: [employer] Saturday and Sunday for 45 minutes and plans to start some yoga and basic exercises soon.  Estimated energy needs: 1500 calories 225 g carbohydrates 60-75 g protein 30-40 g fat  Progress Towards Goal(s):  In progress.   Nutritional Diagnosis:  NB-1.1 Food and nutrition-related knowledge deficit As related to balanced vegan diet related to diverticulitis.  As evidenced by patient report.    Intervention:  Nutrition education related to a balanced vegan diet.  Discussed moderate carbohydrate due to increased triglycerides and low fiber diet related to diverticulitis.  Discussed that after symptoms resolve, her diet can gradually  increase in fiber to goal of 25 grams of fiber daily.  Discussed need to avoid alcohol for life.  Discussed meal options currently and later to meet nutritional needs.    Avoid Alcohol  Consider 1000-2000 vitamin D3 daily Continue multi vitamin and vitamin B-12 Consider probiotic Protein shake daily  Skinny gut, mix with almond milk, fruit if desired (banana, mango, peaches)  Unjury Chicken Broth (tepid water) Breakfast daily  Avocado on bagel  Tofu scrambled burrito  Oatmeal (higher fiber)  Quinoa cereal (higher fiber)  Smoothie  with protein  Protein shake  Leftovers   Dinner ideas: that are low in fiber  Vegan Shepard's pie with vege crubles, carrots, onions, mushrooms  Stir fry with tofu, carrots, mushrooms, onions  Marinated tofu, baked potato (no skin), green beans  Asian soup with tofu   Vege sausage, rice, roasted carrots and onions  Snacks  Vegan yogurt (silk)  Vegetable juice without fiber (low on fruit) is fine  Stay active  Vegan resources: [email or web address] Vegan for her -by Suzi RootsVirgina Messina, RD  Teaching Method Utilized:  Visual Auditory  Handouts given during visit include:  Low Fiber (8 grams) nutrition therapy from AND  Fiber Restricted (13 grams) nutrition therapy from AND  Barriers to learning/adherence to lifestyle change: none  Demonstrated degree of understanding via:  Teach Back   Monitoring/Evaluation:  Dietary intake, exercise, and body weight prn.

## 2017-12-25 NOTE — Patient Instructions (Addendum)
Avoid Alcohol  Consider 1000-2000 vitamin D3 daily Continue multi vitamin and vitamin B-12 Consider probiotic Protein shake daily  Skinny gut, mix with almond milk, fruit if desired (banana, mango, peaches)  Unjury Chicken Broth (tepid water) Breakfast daily  Avocado on bagel  Tofu scrambled burrito  Oatmeal (higher fiber)  Quinoa cereal (higher fiber)  Smoothie with protein  Protein shake  Leftovers   Dinner ideas: that are low in fiber  Vegan Shepard's pie with vege crubles, carrots, onions, mushrooms  Stir fry with tofu, carrots, mushrooms, onions  Marinated tofu, baked potato (no skin), green beans  Asian soup with tofu   Vege sausage, rice, roasted carrots and onions  Snacks  Vegan yogurt (silk)  Vegetable juice without fiber (low on fruit) is fine  Stay active  Vegan resources: P[employer] Conference [email or web address] Vegan for her -by Suzi Roots, RD

## 2017-12-30 ENCOUNTER — Other Ambulatory Visit: Payer: Self-pay

## 2018-01-26 ENCOUNTER — Encounter: Payer: Self-pay | Admitting: Family Medicine

## 2018-01-26 ENCOUNTER — Ambulatory Visit (INDEPENDENT_AMBULATORY_CARE_PROVIDER_SITE_OTHER): Payer: No Typology Code available for payment source | Admitting: Family Medicine

## 2018-01-26 VITALS — BP 130/88 | HR 79 | Temp 98.0°F | Ht 62.5 in | Wt 172.4 lb

## 2018-01-26 DIAGNOSIS — K1120 Sialoadenitis, unspecified: Secondary | ICD-10-CM

## 2018-01-26 LAB — POC INFLUENZA A&B (BINAX/QUICKVUE)
Influenza A, POC: NEGATIVE
Influenza B, POC: NEGATIVE

## 2018-01-26 LAB — POCT RAPID STREP A (OFFICE): Rapid Strep A Screen: NEGATIVE

## 2018-01-26 MED ORDER — AMOXICILLIN 875 MG PO TABS
875.0000 mg | ORAL_TABLET | Freq: Two times a day (BID) | ORAL | 0 refills | Status: DC
Start: 2018-01-26 — End: 2018-02-05

## 2018-01-26 NOTE — Progress Notes (Signed)
Joyce Cline is a 47 y.o. female here for an acute visit.  History of Present Illness:   [name] CMA acting as scribe for Dr. Earlene Plater  HPI: Patient comes in today for an acute issue. She stated that she started having left jaw pain and swollen lymph nodes yesterday. Its has now spread to where she has a knot behind her left ear and the swelling is in the right jaw.   PMHx, SurgHx, SocialHx, Medications, and Allergies were reviewed in the Visit Navigator and updated as appropriate.  Current Medications:   .  amLODipine (NORVASC) 5 MG tablet, TAKE 1 TABLET BY MOUTH EVERY DAY, Disp: 90 tablet, Rfl: 1 .  carvedilol (COREG) 25 MG tablet, Take 1 tablet (25 mg total) by mouth 2 (two) times daily., Disp: 180 tablet, Rfl: 0 .  CREON 36000 units CPEP capsule, Take 2 capsules by mouth 3 (three) times daily., Disp: , Rfl: 11 .  Cyanocobalamin (VITAMIN B-12) 6000 MCG SUBL, Place 2,000 mcg under the tongue daily. , Disp: , Rfl:  .  fenofibrate 160 MG tablet, Take 1 tablet (160 mg total) by mouth daily., Disp: 90 tablet, Rfl: 3 .  HYDROcodone-acetaminophen (NORCO/VICODIN) 5-325 MG tablet, Take 1-2 tablets by mouth every 6 (six) hours as needed for severe pain., Disp: 10 tablet, Rfl: 0 .  Multiple Vitamin (MULTIVITAMIN WITH MINERALS) TABS tablet, Take 1 tablet by mouth daily., Disp: , Rfl:  .  omega-3 acid ethyl esters (LOVAZA) 1 g capsule, Take 2 capsules (2 g total) 2 (two) times daily by mouth., Disp: 120 capsule, Rfl: 11 .  pioglitazone (ACTOS) 45 MG tablet, Take 1 tablet (45 mg total) by mouth daily., Disp: 90 tablet, Rfl: 3 .  pravastatin (PRAVACHOL) 10 MG tablet, Take 1 tablet (10 mg total) by mouth daily., Disp: 90 tablet, Rfl: 0 .  traMADol (ULTRAM) 50 MG tablet, Take 1 tablet (50 mg total) by mouth every 6 (six) hours as needed for moderate pain., Disp: 30 tablet, Rfl: 0 .  Turmeric 500 MG CAPS, Take 500 mg by mouth 2 (two) times daily., Disp: , Rfl:    No Known Allergies     Review of Systems:   Pertinent items are noted in the HPI. Otherwise, ROS is negative.  Vitals:   Vitals:   01/26/18 1520  BP: 130/88  Pulse: 79  Temp: 98 F (36.7 C)  TempSrc: Oral  SpO2: 96%  Weight: 172 lb 6.4 oz (78.2 kg)  Height: 5' 2.5" (1.588 m)     Body mass index is 31.03 kg/m.  Physical Exam:   Physical Exam  Constitutional: She appears well-nourished.  HENT:  Head: Normocephalic and atraumatic.  Right Ear: External ear normal.  Left Ear: External ear normal.  Nose: Nose normal.  Mouth/Throat: Oropharynx is clear and moist.  Left parotid gland swollen.  Eyes: EOM are normal. Pupils are equal, round, and reactive to light.  Neck: Normal range of motion. Neck supple.  Cardiovascular: Normal rate, regular rhythm, normal heart sounds and intact distal pulses.  Pulmonary/Chest: Effort normal.  Abdominal: Soft.  Skin: Skin is warm.  Psychiatric: She has a normal mood and affect. Her behavior is normal.  Nursing note and vitals reviewed.   Results for orders placed or performed in visit on 01/26/18  POC Influenza A&B(BINAX/QUICKVUE)  Result Value Ref Range   Influenza A, POC Negative Negative   Influenza B, POC Negative Negative  POCT rapid strep A  Result Value Ref Range   Rapid  Strep A Screen Negative Negative   Assessment and Plan:   1. Parotitis - POC Influenza A&B(BINAX/QUICKVUE) - POCT rapid strep A - amoxicillin (AMOXIL) 875 MG tablet; Take 1 tablet (875 mg total) by mouth 2 (two) times daily.  (SAFETY NET)   . Reviewed expectations re: course of current medical issues. . Discussed self-management of symptoms. . Outlined signs and symptoms indicating need for more acute intervention. . Patient verbalized understanding and all questions were answered. Marland Kitchen Health Maintenance issues including appropriate healthy diet, exercise, and smoking avoidance were discussed with patient. . See orders for this visit as documented in the electronic medical  record. . Patient received an After Visit Summary.  CMA served as Neurosurgeon during this visit. History, Physical, and Plan performed by medical provider. The above documentation has been reviewed and is accurate and complete. [name], D.O.   [name], DO [place], [facility] 01/28/2018

## 2018-02-04 ENCOUNTER — Ambulatory Visit: Payer: Self-pay

## 2018-02-04 ENCOUNTER — Telehealth: Payer: Self-pay | Admitting: Family Medicine

## 2018-02-04 NOTE — Telephone Encounter (Signed)
Please be advised.   Copied from CRM [id number]. Topic: Quick Communication - See Telephone Encounter >> Feb 04, 2018  4:27 PM Cipriano Bunker wrote: CRM for notification. See Telephone encounter for:   Nurse Triage message was sent earlier/. Pt called back and we scheduled an appt for 8:20 am with Dr. Jimmey Ralph tomorrow..  Pt. Said the rash has spread and she also thought it may be yeast infection??   02/04/18.

## 2018-02-04 NOTE — Telephone Encounter (Signed)
FYI

## 2018-02-04 NOTE — Telephone Encounter (Signed)
   Reason for Disposition . Mild widespread rash  Answer Assessment - Initial Assessment Questions 1. APPEARANCE of RASH: "Describe the rash." (e.g., spots, blisters, raised areas, skin peeling, scaly)     Spots,flat 2. SIZE: "How big are the spots?" (e.g., tip of pen, eraser, coin; inches, centimeters)     Tip of pen 3. LOCATION: "Where is the rash located?"     Trunk 4. COLOR: "What color is the rash?" (Note: It is difficult to assess rash color in people with darker-colored skin. When this situation occurs, simply ask the caller to describe what they see.)     Red 5. ONSET: "When did the rash begin?"     This morning 6. FEVER: "Do you have a fever?" If so, ask: "What is your temperature, how was it measured, and when did it start?"     No 7. ITCHING: "Does the rash itch?" If so, ask: "How bad is the itch?" (Scale 1-10; or mild, moderate, severe)     Itching 8. CAUSE: "What do you think is causing the rash?"     Antibiotic 9. MEDICATION FACTORS: "Have you started any new medications within the last 2 weeks?" (e.g., antibiotics)      Amoxil 10. OTHER SYMPTOMS: "Do you have any other symptoms?" (e.g., dizziness, headache, sore throat, joint pain)       Diarrhea last night 11. PREGNANCY: "Is there any chance you are pregnant?" "When was your last menstrual period?"       No  Protocols used: RASH OR REDNESS - [facility]

## 2018-02-04 NOTE — Telephone Encounter (Signed)
Please be advised.  °

## 2018-02-05 ENCOUNTER — Ambulatory Visit (INDEPENDENT_AMBULATORY_CARE_PROVIDER_SITE_OTHER): Payer: No Typology Code available for payment source | Admitting: Family Medicine

## 2018-02-05 ENCOUNTER — Encounter: Payer: Self-pay | Admitting: Family Medicine

## 2018-02-05 ENCOUNTER — Ambulatory Visit: Payer: No Typology Code available for payment source | Admitting: Family Medicine

## 2018-02-05 VITALS — BP 124/72 | HR 82 | Temp 98.3°F | Ht 62.5 in | Wt 168.8 lb

## 2018-02-05 DIAGNOSIS — L270 Generalized skin eruption due to drugs and medicaments taken internally: Secondary | ICD-10-CM

## 2018-02-05 NOTE — Progress Notes (Signed)
    Subjective:  Joyce Cline is a 47 y.o. female who presents today for same-day appointment with a chief complaint of rash.   HPI:  Rash, Acute Issue Started yesterday.  About a week ago, patient was started on a course of amoxicillin for parotitis.  She has not taken any further antibiotics since the rash started.  Rash initially located on her upper abdomen and then spread to involve her chest and upper extremities.  Denies any shortness of breath, wheezing, or difficulty speaking or swallowing over the past day.  Tried using a mixture of hydrocortisone and Lotrimin which helped with the rash.  Rash is much better today compared to yesterday.  ROS: Per HPI  Objective:  Physical Exam: BP 124/72 (BP Location: Left Arm, Patient Position: Sitting, Cuff Size: Normal)   Pulse 82   Temp 98.3 F (36.8 C) (Oral)   Ht 5' 2.5" (1.588 m)   Wt 168 lb 12.8 oz (76.6 kg)   SpO2 97%   BMI 30.38 kg/m   Gen: NAD, resting comfortably Skin: Maculopapular erythematous rash on trunk with discrete lesions.  No purulence or pustules.  No spreading erythema.  No pain.  Assessment/Plan:  Rash Consistent with drug eruption to amoxicillin.  No signs or symptoms of airway involvement. Will add amoxil to her allergy list.  Recommended continued use of topical over-the-counter hydrocortisone cream as needed for itching.  Also recommended over-the-counter oral antihistamines.  Return precautions reviewed. Follow up as needed.   Katina Degree. Jimmey Ralph, MD 02/05/2018 8:44 AM

## 2018-02-05 NOTE — Telephone Encounter (Signed)
Dr. Wallace, please see message and advise. 

## 2018-03-09 ENCOUNTER — Telehealth: Payer: Self-pay

## 2018-03-09 NOTE — Telephone Encounter (Signed)
Patient called and states that she is suppose to get her lab work done that was ordered in November and wanted to see if she could get this done through her work, they have [facility] nurse on site and they would fax results to our office when completed. This would help with not having to make two appointments. Fax number is [phone]. Attention Nance Pew, RN. If you have any questions patient's call back is [phone]. -Consuella Lose, RMA

## 2018-03-10 ENCOUNTER — Other Ambulatory Visit: Payer: Self-pay

## 2018-03-10 DIAGNOSIS — E781 Pure hyperglyceridemia: Secondary | ICD-10-CM

## 2018-03-10 NOTE — Telephone Encounter (Signed)
Ok.  Fasting lipids

## 2018-03-10 NOTE — Telephone Encounter (Signed)
Can needed labs be ordered & I will fax them to nurse at patient's place of employment? Please advise?

## 2018-03-10 NOTE — Telephone Encounter (Signed)
LVM to let patient know that I had faxed over lab requisition to Nance Pew, RN.

## 2018-03-16 ENCOUNTER — Ambulatory Visit: Payer: Self-pay | Admitting: *Deleted

## 2018-03-16 DIAGNOSIS — E781 Pure hyperglyceridemia: Secondary | ICD-10-CM

## 2018-03-16 NOTE — Progress Notes (Signed)
Labs per Endocrinology drawn. Will route to ordering provider upon receipt tomorrow.

## 2018-03-16 NOTE — Addendum Note (Signed)
Addended by: Loraine Grip on: 03/16/2018 10:18 AM   Modules accepted: Orders

## 2018-03-17 LAB — LIPID PANEL
CHOLESTEROL TOTAL: 243 mg/dL — AB (ref 100–199)
Chol/HDL Ratio: 5.4 ratio — ABNORMAL HIGH (ref 0.0–4.4)
HDL: 45 mg/dL (ref >39)
Triglycerides: 533 mg/dL — ABNORMAL HIGH (ref 0–149)

## 2018-03-17 NOTE — Progress Notes (Signed)
Results reviewed with pt via phone. Triglycerides continuing to trend down but still elevated. Is on a treatment plan for hypertriglyceridemia under endocrinologist/internal med. Results ordered under Dr. Everardo All, so will not be routed to him at this time. Informed pt that it does not look like he has placed any notes in MyChart yet, but she should get a notification when he does and that she can access a copy of results once he releases them. She has no further questions/concerns.

## 2018-04-11 ENCOUNTER — Other Ambulatory Visit: Payer: Self-pay | Admitting: Physician Assistant

## 2018-04-12 ENCOUNTER — Other Ambulatory Visit: Payer: Self-pay | Admitting: Physician Assistant

## 2018-04-12 MED ORDER — CARVEDILOL 25 MG PO TABS: 25.0000 mg | ORAL_TABLET | Freq: Two times a day (BID) | ORAL | 0 refills | Status: DC

## 2018-04-12 MED ORDER — PRAVASTATIN SODIUM 10 MG PO TABS: 10.0000 mg | ORAL_TABLET | Freq: Every day | ORAL | 0 refills | Status: DC

## 2018-04-13 ENCOUNTER — Other Ambulatory Visit: Payer: Self-pay | Admitting: Physician Assistant

## 2018-04-13 ENCOUNTER — Encounter: Payer: Self-pay | Admitting: Endocrinology

## 2018-04-13 ENCOUNTER — Ambulatory Visit (INDEPENDENT_AMBULATORY_CARE_PROVIDER_SITE_OTHER): Payer: No Typology Code available for payment source | Admitting: Endocrinology

## 2018-04-13 VITALS — BP 118/72 | HR 76 | Wt 171.0 lb

## 2018-04-13 DIAGNOSIS — E781 Pure hyperglyceridemia: Secondary | ICD-10-CM | POA: Diagnosis not present

## 2018-04-13 DIAGNOSIS — R739 Hyperglycemia, unspecified: Secondary | ICD-10-CM | POA: Diagnosis not present

## 2018-04-13 DIAGNOSIS — K861 Other chronic pancreatitis: Secondary | ICD-10-CM | POA: Diagnosis not present

## 2018-04-13 LAB — LIPID PANEL
Cholesterol: 234 mg/dL — ABNORMAL HIGH (ref 0–200)
HDL: 42.4 mg/dL (ref >39.00)
Total CHOL/HDL Ratio: 6
Triglycerides: 565 mg/dL — ABNORMAL HIGH (ref 0.0–149.0)

## 2018-04-13 LAB — HEMOGLOBIN A1C: Hgb A1c MFr Bld: 4.9 % (ref 4.6–6.5)

## 2018-04-13 LAB — LIPASE: LIPASE: 31 U/L (ref 11.0–59.0)

## 2018-04-13 LAB — LDL CHOLESTEROL, DIRECT: Direct LDL: 90 mg/dL

## 2018-04-13 LAB — AMYLASE: AMYLASE: 28 U/L (ref 27–131)

## 2018-04-13 NOTE — Patient Instructions (Addendum)
blood tests are requested for you today.  We'll let you know about the results.   Please come back for a follow-up appointment in 6 months.  

## 2018-04-13 NOTE — Progress Notes (Signed)
Subjective:    Patient ID: Joyce Cline, female    DOB: [birth year], 47 y.o.   MRN: [id number]  HPI  Pt returnd for f/u of hypertriglyceridemia (dx'ed 2013; onl possible secondary cause found was B-blocker; he has had several episodes of pancreatitis (most recently in mid-2018, when TG were over 2000); she was rx'ed with lipophoresis; since then, she has been on creon, pioglitazone, lovaza, pravachol, and fenofibrate).  She has slight swelling of the legs, but no assoc sob.   Past Medical History:  Diagnosis Date  . Anemia   . Anxiety   . Chronic pancreatitis (HCC)   . History of blood transfusion   . HLD (hyperlipidemia)   . Hypertension   . NICM (nonischemic cardiomyopathy) (HCC)   . Splenic hemorrhage   . Viral myocarditis     Past Surgical History:  Procedure Laterality Date  . CESAREAN SECTION    . CHOLECYSTECTOMY    . ESOPHAGOGASTRODUODENOSCOPY N/A 05/08/2017   Procedure: ESOPHAGOGASTRODUODENOSCOPY (EGD);  Surgeon: Hilarie Fredrickson, MD;  Location: [facility];  Service: Endoscopy;  Laterality: N/A;  . KNEE SURGERY Right 1988  . WISDOM TOOTH EXTRACTION Bilateral 1990    Social History   Socioeconomic History  . Marital status: Single    Spouse name: Not on file  . Number of children: 2  . Years of education: Not on file  . Highest education level: Not on file  Occupational History  . Occupation: customer experience  Social Needs  . Financial resource strain: Not on file  . Food insecurity:    Worry: Not on file    Inability: Not on file  . Transportation needs:    Medical: Not on file    Non-medical: Not on file  Tobacco Use  . Smoking status: Current Every Day Smoker    Types: Cigarettes  . Smokeless tobacco: Never Used  . Tobacco comment: 2 per day  Substance and Sexual Activity  . Alcohol use: Yes    Alcohol/week: 3.0 oz    Types: 5 Glasses of wine per week    Comment: 1-2 per day  . Drug use: No  . Sexual activity: Yes    Birth  control/protection: IUD  Lifestyle  . Physical activity:    Days per week: Not on file    Minutes per session: Not on file  . Stress: Not on file  Relationships  . Social connections:    Talks on phone: Not on file    Gets together: Not on file    Attends religious service: Not on file    Active member of club or organization: Not on file    Attends meetings of clubs or organizations: Not on file    Relationship status: Not on file  . Intimate partner violence:    Fear of current or ex partner: Not on file    Emotionally abused: Not on file    Physically abused: Not on file    Forced sexual activity: Not on file  Other Topics Concern  . Not on file  Social History Narrative   Moved here about 6 months ago   Daughter lives here   Work -- Engineer, [employer]    Current Outpatient Medications on File Prior to Visit  Medication Sig Dispense Refill  . amLODipine (NORVASC) 5 MG tablet TAKE 1 TABLET BY MOUTH EVERY DAY 90 tablet 1  . carvedilol (COREG) 25 MG tablet Take 1 tablet (25 mg total) by mouth 2 (two)  times daily. 180 tablet 0  . CREON 36000 units CPEP capsule Take 2 capsules by mouth 3 (three) times daily.  11  . Cyanocobalamin (VITAMIN B-12) 6000 MCG SUBL Place 2,000 mcg under the tongue daily.     . fenofibrate 160 MG tablet Take 1 tablet (160 mg total) by mouth daily. 90 tablet 3  . Multiple Vitamin (MULTIVITAMIN WITH MINERALS) TABS tablet Take 1 tablet by mouth daily.    Marland Kitchen omega-3 acid ethyl esters (LOVAZA) 1 g capsule Take 2 capsules (2 g total) 2 (two) times daily by mouth. 120 capsule 11  . pioglitazone (ACTOS) 45 MG tablet Take 1 tablet (45 mg total) by mouth daily. 90 tablet 3  . pravastatin (PRAVACHOL) 10 MG tablet Take 1 tablet (10 mg total) by mouth daily. 90 tablet 0  . traMADol (ULTRAM) 50 MG tablet Take 1 tablet (50 mg total) by mouth every 6 (six) hours as needed for moderate pain. 30 tablet 0  . Turmeric 500 MG CAPS Take 500 mg by  mouth 2 (two) times daily.     No current facility-administered medications on file prior to visit.     Allergies  Allergen Reactions  . Amoxil [Amoxicillin] Rash    Family History  Problem Relation Age of Onset  . Uterine cancer Maternal Grandmother   . Hypertension Mother   . Polycythemia Father   . Cancer Maternal Grandfather        type unknown  . Diabetes Paternal Grandmother   . Esophageal cancer Paternal Grandfather   . Hyperlipidemia Neg Hx     BP 118/72 (BP Location: Left Arm, Patient Position: Sitting, Cuff Size: Normal)   Pulse 76   Wt 171 lb (77.6 kg)   SpO2 97%   BMI 30.78 kg/m   Review of Systems Denies chest pain and abd pain.      Objective:   Physical Exam VITAL SIGNS:  See vs page. GENERAL: no distress.  Legs: trace bilat edema.  Lab Results  Component Value Date   CHOL 243 (H) 03/16/2018   HDL 45 03/16/2018   LDLCALC Comment 03/16/2018   LDLDIRECT 112.0 11/11/2017   TRIG 533 (H) 03/16/2018   CHOLHDL 5.4 (H) 03/16/2018       Assessment & Plan:  Edema, new, prob due to pioglitazone.  We'll follow.   Hypertriglyceridemia: due for recheck.    Patient Instructions  blood tests are requested for you today.  We'll let you know about the results. Please come back for a follow-up appointment in 6 months.

## 2018-04-14 NOTE — Telephone Encounter (Signed)
Pt was referred to Dr. Everardo All for hypertriglyceridemia and was seen yesterday (had labs checked). Will defer request for refill of fenofibrate to Dr. Everardo All.

## 2018-06-17 ENCOUNTER — Encounter: Payer: Self-pay | Admitting: Physician Assistant

## 2018-06-29 ENCOUNTER — Encounter: Payer: Self-pay | Admitting: Physician Assistant

## 2018-06-29 ENCOUNTER — Ambulatory Visit (INDEPENDENT_AMBULATORY_CARE_PROVIDER_SITE_OTHER): Payer: No Typology Code available for payment source | Admitting: Physician Assistant

## 2018-06-29 VITALS — BP 130/80 | HR 73 | Temp 98.2°F | Ht 62.5 in | Wt 167.0 lb

## 2018-06-29 DIAGNOSIS — M25562 Pain in left knee: Secondary | ICD-10-CM | POA: Diagnosis not present

## 2018-06-29 DIAGNOSIS — M25531 Pain in right wrist: Secondary | ICD-10-CM

## 2018-06-29 DIAGNOSIS — K861 Other chronic pancreatitis: Secondary | ICD-10-CM

## 2018-06-29 DIAGNOSIS — M25561 Pain in right knee: Secondary | ICD-10-CM | POA: Diagnosis not present

## 2018-06-29 NOTE — Progress Notes (Signed)
Joyce Cline is a 47 y.o. female here for a new problem.  I acted as a Neurosurgeon for [employer], PA-C Corky Mull, LPN  History of Present Illness:   Chief Complaint  Patient presents with  . Right hand pain  . Bilateral Knee pain    Larey Seat Saturday   She is R hand dominant.  Hand Pain   Incident onset: Pt has been having hand pain for the past year but has gotten worse the past 2 months. There was no injury mechanism. The pain is present in the right hand and right wrist. The quality of the pain is described as aching and shooting. Radiates to: right elbow. The pain is at a severity of 3/10. The pain is mild. The pain has been fluctuating since the incident. The symptoms are aggravated by movement. Treatments tried: Advil. The treatment provided no relief.   Knee Pain Over the weekend, she fell off a barstool and fell onto both of her knees. She has had intermittent pain since that time. Denies severe swelling. She has had bruising that has improved with time. She has not required any Tramadol for her symptoms. No issues with her gait.  Pancreatitis She does not take her Creon as she should, states that it is very expensive. Has chronic abdominal pain, no worse than usual right now. Continues to have fatty stools. Continues to see Dr. Everardo All for her triglycerides. Continues on Lovaza.   Past Medical History:  Diagnosis Date  . Anemia   . Anxiety   . Chronic pancreatitis (HCC)   . History of blood transfusion   . HLD (hyperlipidemia)   . Hypertension   . NICM (nonischemic cardiomyopathy) (HCC)   . Splenic hemorrhage   . Viral myocarditis      Social History   Socioeconomic History  . Marital status: Single    Spouse name: Not on file  . Number of children: 2  . Years of education: Not on file  . Highest education level: Not on file  Occupational History  . Occupation: customer experience  Social Needs  . Financial resource strain: Not on file  .  Food insecurity:    Worry: Not on file    Inability: Not on file  . Transportation needs:    Medical: Not on file    Non-medical: Not on file  Tobacco Use  . Smoking status: Current Every Day Smoker    Types: Cigarettes  . Smokeless tobacco: Never Used  . Tobacco comment: 2 per day  Substance and Sexual Activity  . Alcohol use: Yes    Alcohol/week: 3.0 oz    Types: 5 Glasses of wine per week    Comment: 1-2 per day  . Drug use: No  . Sexual activity: Yes    Birth control/protection: IUD  Lifestyle  . Physical activity:    Days per week: Not on file    Minutes per session: Not on file  . Stress: Not on file  Relationships  . Social connections:    Talks on phone: Not on file    Gets together: Not on file    Attends religious service: Not on file    Active member of club or organization: Not on file    Attends meetings of clubs or organizations: Not on file    Relationship status: Not on file  . Intimate partner violence:    Fear of current or ex partner: Not on file    Emotionally abused: Not on file  Physically abused: Not on file    Forced sexual activity: Not on file  Other Topics Concern  . Not on file  Social History Narrative   Moved here about 6 months ago   Daughter lives here   Work -- Engineer, drilling [employer]    Past Surgical History:  Procedure Laterality Date  . CESAREAN SECTION    . CHOLECYSTECTOMY    . ESOPHAGOGASTRODUODENOSCOPY N/A 05/08/2017   Procedure: ESOPHAGOGASTRODUODENOSCOPY (EGD);  Surgeon: Hilarie Fredrickson, MD;  Location: [facility];  Service: Endoscopy;  Laterality: N/A;  . KNEE SURGERY Right 1988  . WISDOM TOOTH EXTRACTION Bilateral 1990    Family History  Problem Relation Age of Onset  . Uterine cancer Maternal Grandmother   . Hypertension Mother   . Polycythemia Father   . Cancer Maternal Grandfather        type unknown  . Diabetes Paternal Grandmother   . Esophageal cancer Paternal Grandfather   .  Hyperlipidemia Neg Hx     Allergies  Allergen Reactions  . Amoxil [Amoxicillin] Rash    Current Medications:   Current Outpatient Medications:  .  amLODipine (NORVASC) 5 MG tablet, TAKE 1 TABLET BY MOUTH EVERY DAY, Disp: 90 tablet, Rfl: 1 .  carvedilol (COREG) 25 MG tablet, Take 1 tablet (25 mg total) by mouth 2 (two) times daily., Disp: 180 tablet, Rfl: 0 .  CREON 36000 units CPEP capsule, Take 2 capsules by mouth 3 (three) times daily., Disp: , Rfl: 11 .  Cyanocobalamin (VITAMIN B-12) 6000 MCG SUBL, Place 2,000 mcg under the tongue daily. , Disp: , Rfl:  .  fenofibrate 160 MG tablet, Take 1 tablet by mouth daily., Disp: 90 tablet, Rfl: 3 .  Multiple Vitamin (MULTIVITAMIN WITH MINERALS) TABS tablet, Take 1 tablet by mouth daily., Disp: , Rfl:  .  omega-3 acid ethyl esters (LOVAZA) 1 g capsule, Take 2 capsules (2 g total) 2 (two) times daily by mouth., Disp: 120 capsule, Rfl: 11 .  pioglitazone (ACTOS) 45 MG tablet, Take 1 tablet (45 mg total) by mouth daily., Disp: 90 tablet, Rfl: 3 .  pravastatin (PRAVACHOL) 10 MG tablet, Take 1 tablet (10 mg total) by mouth daily., Disp: 90 tablet, Rfl: 0 .  traMADol (ULTRAM) 50 MG tablet, Take 1 tablet (50 mg total) by mouth every 6 (six) hours as needed for moderate pain., Disp: 30 tablet, Rfl: 0 .  Turmeric 500 MG CAPS, Take 500 mg by mouth 2 (two) times daily., Disp: , Rfl:    Review of Systems:   Review of Systems  Negative unless otherwise specified per HPI.   Vitals:   Vitals:   06/29/18 0823  BP: 130/80  Pulse: 73  Temp: 98.2 F (36.8 C)  TempSrc: Oral  SpO2: 97%  Weight: 167 lb (75.8 kg)  Height: 5' 2.5" (1.588 m)     Body mass index is 30.06 kg/m.  Physical Exam:   Physical Exam  Constitutional: She appears well-developed. She is cooperative.  Non-toxic appearance. She does not have a sickly appearance. She does not appear ill. No distress.  Cardiovascular: Normal rate, regular rhythm, S1 normal, S2 normal, normal heart  sounds and normal pulses.  No LE edema  Pulmonary/Chest: Effort normal and breath sounds normal.  Abdominal: Normal appearance and bowel sounds are normal. There is tenderness in the epigastric area. There is no rigidity, no rebound, no guarding and no CVA tenderness.  Musculoskeletal:  Right Wrist/Hand: Overall wrist  is well aligned, no significant  deformity or atrophy.  Swelling to thenar eminence. No effusion appreciated. Grip strength intact. No significant TTP over the anatomic snuff box, distal radius & ulna. Positive Finkelstein's.  Bilateral Knee: Overall joint is well aligned, no significant deformity.   No significant effusion. Normal ROM. Extensor mechanism intact. No significant medial or lateral joint line tenderness. Ecchymosis present. Stable to varus/valgus strain.  Neurological: She is alert. GCS eye subscore is 4. GCS verbal subscore is 5. GCS motor subscore is 6.  Grip strength 5/5 bilaterally. Normal sensation to bilateral knees and R wrist.  Skin: Skin is warm, dry and intact.  Psychiatric: She has a normal mood and affect. Her speech is normal and behavior is normal.  Nursing note and vitals reviewed.   Assessment and Plan:    Idil was seen today for right hand pain and bilateral knee pain.  Diagnoses and all orders for this visit:  Right wrist pain Suspect possible DeQuervain's tenosynovitis. Discussed use of wrist brace if needed, anti-inflammatories and cool water soaks for compression and to help swelling. She is possibly interested in injections if indicated, I have put in a referral to Dr. Gaspar Bidding here in our office.  Acute pain of both knees Acute trauma that resulted in bilateral ecchymosis, improving with time. Exam benign. No intervention at this time. Follow-up if worsening symptoms.  Chronic pancreatitis, unspecified pancreatitis type (HCC) I really think her symptoms are uncontrolled due to lack of compliance with Creon. Steatorrhea persists.  Will have her try to go through patient assistance through Creon website, if unsuccessful, she will need to reach out to GI to see if they have additional resources.   . Reviewed expectations re: course of current medical issues. . Discussed self-management of symptoms. . Outlined signs and symptoms indicating need for more acute intervention. . Patient verbalized understanding and all questions were answered. . See orders for this visit as documented in the electronic medical record. . Patient received an After-Visit Summary.  CMA or LPN served as scribe during this visit. History, Physical, and Plan performed by medical provider. Documentation and orders reviewed and attested to.   [name], PA-C

## 2018-06-29 NOTE — Patient Instructions (Signed)
It was great to see you!  Please make an appointment with Dr. Berline Chough at your earliest convenience for further evaluation of your wrist and potential injections or other treatment.  I will be in touch after I investigate the Creon situation we discussed.  Follow-up at your convenience for a physical.

## 2018-07-05 ENCOUNTER — Encounter: Payer: Self-pay | Admitting: Physician Assistant

## 2018-07-05 ENCOUNTER — Ambulatory Visit (INDEPENDENT_AMBULATORY_CARE_PROVIDER_SITE_OTHER): Payer: No Typology Code available for payment source | Admitting: Physician Assistant

## 2018-07-05 ENCOUNTER — Ambulatory Visit (INDEPENDENT_AMBULATORY_CARE_PROVIDER_SITE_OTHER): Payer: No Typology Code available for payment source | Admitting: Sports Medicine

## 2018-07-05 ENCOUNTER — Encounter: Payer: Self-pay | Admitting: Sports Medicine

## 2018-07-05 ENCOUNTER — Other Ambulatory Visit (HOSPITAL_COMMUNITY)
Admission: RE | Admit: 2018-07-05 | Discharge: 2018-07-05 | Disposition: A | Discharge status: Home / Self Care | Payer: No Typology Code available for payment source | Source: Ambulatory Visit | Attending: Physician Assistant | Admitting: Physician Assistant

## 2018-07-05 ENCOUNTER — Ambulatory Visit: Payer: Self-pay

## 2018-07-05 ENCOUNTER — Ambulatory Visit: Payer: No Typology Code available for payment source | Admitting: Sports Medicine

## 2018-07-05 VITALS — BP 106/70 | HR 70 | Temp 98.2°F | Ht 62.5 in | Wt 167.0 lb

## 2018-07-05 VITALS — BP 106/70 | HR 70 | Ht 62.5 in | Wt 167.0 lb

## 2018-07-05 DIAGNOSIS — I428 Other cardiomyopathies: Secondary | ICD-10-CM

## 2018-07-05 DIAGNOSIS — M1811 Unilateral primary osteoarthritis of first carpometacarpal joint, right hand: Secondary | ICD-10-CM

## 2018-07-05 DIAGNOSIS — Z0001 Encounter for general adult medical examination with abnormal findings: Secondary | ICD-10-CM

## 2018-07-05 DIAGNOSIS — M25531 Pain in right wrist: Secondary | ICD-10-CM | POA: Diagnosis not present

## 2018-07-05 DIAGNOSIS — R102 Pelvic and perineal pain: Secondary | ICD-10-CM | POA: Diagnosis not present

## 2018-07-05 DIAGNOSIS — Z124 Encounter for screening for malignant neoplasm of cervix: Secondary | ICD-10-CM | POA: Insufficient documentation

## 2018-07-05 DIAGNOSIS — E669 Obesity, unspecified: Secondary | ICD-10-CM | POA: Diagnosis not present

## 2018-07-05 DIAGNOSIS — K861 Other chronic pancreatitis: Secondary | ICD-10-CM | POA: Diagnosis not present

## 2018-07-05 DIAGNOSIS — R42 Dizziness and giddiness: Secondary | ICD-10-CM | POA: Diagnosis not present

## 2018-07-05 DIAGNOSIS — K625 Hemorrhage of anus and rectum: Secondary | ICD-10-CM

## 2018-07-05 DIAGNOSIS — I10 Essential (primary) hypertension: Secondary | ICD-10-CM

## 2018-07-05 DIAGNOSIS — E781 Pure hyperglyceridemia: Secondary | ICD-10-CM | POA: Diagnosis not present

## 2018-07-05 DIAGNOSIS — D638 Anemia in other chronic diseases classified elsewhere: Secondary | ICD-10-CM

## 2018-07-05 DIAGNOSIS — E538 Deficiency of other specified B group vitamins: Secondary | ICD-10-CM

## 2018-07-05 LAB — COMPREHENSIVE METABOLIC PANEL
ALK PHOS: 37 U/L — AB (ref 39–117)
ALT: 26 U/L (ref 0–35)
AST: 19 U/L (ref 0–37)
Albumin: 4.7 g/dL (ref 3.5–5.2)
BUN: 12 mg/dL (ref 6–23)
CO2: 23 meq/L (ref 19–32)
Calcium: 10.1 mg/dL (ref 8.4–10.5)
Chloride: 107 mEq/L (ref 96–112)
Creatinine, Ser: 0.56 mg/dL (ref 0.40–1.20)
GFR: 122.41 mL/min (ref >60.00)
GLUCOSE: 106 mg/dL — AB (ref 70–99)
POTASSIUM: 4.2 meq/L (ref 3.5–5.1)
SODIUM: 140 meq/L (ref 135–145)
Total Bilirubin: 0.8 mg/dL (ref 0.2–1.2)
Total Protein: 7.2 g/dL (ref 6.0–8.3)

## 2018-07-05 LAB — CBC
HCT: 37.7 % (ref 36.0–46.0)
Hemoglobin: 12.9 g/dL (ref 12.0–15.0)
MCHC: 34.3 g/dL (ref 30.0–36.0)
MCV: 97.3 fl (ref 78.0–100.0)
Platelets: 314 10*3/uL (ref 150.0–400.0)
RBC: 3.87 Mil/uL (ref 3.87–5.11)
RDW: 12.9 % (ref 11.5–15.5)
WBC: 7.6 10*3/uL (ref 4.0–10.5)

## 2018-07-05 LAB — URINALYSIS, ROUTINE W REFLEX MICROSCOPIC
Bilirubin Urine: NEGATIVE
Hgb urine dipstick: NEGATIVE
Ketones, ur: NEGATIVE
LEUKOCYTES UA: NEGATIVE
Nitrite: NEGATIVE
PH: 6.5 (ref 5.0–8.0)
RBC / HPF: NONE SEEN (ref 0–?)
Specific Gravity, Urine: 1.025 (ref 1.000–1.030)
Urine Glucose: NEGATIVE
Urobilinogen, UA: 0.2 (ref 0.0–1.0)

## 2018-07-05 LAB — VITAMIN B12: Vitamin B-12: 1392 pg/mL — ABNORMAL HIGH (ref 211–911)

## 2018-07-05 MED ORDER — HYDROCORTISONE ACETATE 25 MG RE SUPP: 25.0000 mg | Freq: Two times a day (BID) | RECTAL | 0 refills | Status: DC

## 2018-07-05 NOTE — Patient Instructions (Addendum)
It was great see you!  1. Please make an appointment with Dr. Dorris Carnes (cardiology) --> yearly follow-up on cardiomyopathy  2. Please make an appointment with Dr. Henrene Pastor (gastroenterology) --> to follow-up and discuss rectal bleeding  3. Please schedule a mammogram  4. Please schedule an eye visit  If your vaginal swabs come back normal, we should consider pelvic floor physical therapy.  Health Maintenance, Female Adopting a healthy lifestyle and getting preventive care can go a long way to promote health and wellness. Talk with your health care provider about what schedule of regular examinations is right for you. This is a good chance for you to check in with your provider about disease prevention and staying healthy. In between checkups, there are plenty of things you can do on your own. Experts have done a lot of research about which lifestyle changes and preventive measures are most likely to keep you healthy. Ask your health care provider for more information. Weight and diet Eat a healthy diet  Be sure to include plenty of vegetables, fruits, low-fat dairy products, and lean protein.  Do not eat a lot of foods high in solid fats, added sugars, or salt.  Get regular exercise. This is one of the most important things you can do for your health. ? Most adults should exercise for at least 150 minutes each week. The exercise should increase your heart rate and make you sweat (moderate-intensity exercise). ? Most adults should also do strengthening exercises at least twice a week. This is in addition to the moderate-intensity exercise.  Maintain a healthy weight  Body mass index (BMI) is a measurement that can be used to identify possible weight problems. It estimates body fat based on height and weight. Your health care provider can help determine your BMI and help you achieve or maintain a healthy weight.  For females 49 years of age and older: ? A BMI below 18.5 is considered  underweight. ? A BMI of 18.5 to 24.9 is normal. ? A BMI of 25 to 29.9 is considered overweight. ? A BMI of 30 and above is considered obese.  Watch levels of cholesterol and blood lipids  You should start having your blood tested for lipids and cholesterol at 49 years of age, then have this test every 5 years.  You may need to have your cholesterol levels checked more often if: ? Your lipid or cholesterol levels are high. ? You are older than 49 years of age. ? You are at high risk for heart disease.  Cancer screening Lung Cancer  Lung cancer screening is recommended for adults 21-84 years old who are at high risk for lung cancer because of a history of smoking.  A yearly low-dose CT scan of the lungs is recommended for people who: ? Currently smoke. ? Have quit within the past 15 years. ? Have at least a 30-pack-year history of smoking. A pack year is smoking an average of one pack of cigarettes a day for 1 year.  Yearly screening should continue until it has been 15 years since you quit.  Yearly screening should stop if you develop a health problem that would prevent you from having lung cancer treatment.  Breast Cancer  Practice breast self-awareness. This means understanding how your breasts normally appear and feel.  It also means doing regular breast self-exams. Let your health care provider know about any changes, no matter how small.  If you are in your 20s or 30s, you should have  a clinical breast exam (CBE) by a health care provider every 1-3 years as part of a regular health exam.  If you are 37 or older, have a CBE every year. Also consider having a breast X-ray (mammogram) every year.  If you have a family history of breast cancer, talk to your health care provider about genetic screening.  If you are at high risk for breast cancer, talk to your health care provider about having an MRI and a mammogram every year.  Breast cancer gene (BRCA) assessment is  recommended for women who have family members with BRCA-related cancers. BRCA-related cancers include: ? Breast. ? Ovarian. ? Tubal. ? Peritoneal cancers.  Results of the assessment will determine the need for genetic counseling and BRCA1 and BRCA2 testing.  Cervical Cancer Your health care provider may recommend that you be screened regularly for cancer of the pelvic organs (ovaries, uterus, and vagina). This screening involves a pelvic examination, including checking for microscopic changes to the surface of your cervix (Pap test). You may be encouraged to have this screening done every 3 years, beginning at age 56.  For women ages 31-65, health care providers may recommend pelvic exams and Pap testing every 3 years, or they may recommend the Pap and pelvic exam, combined with testing for human papilloma virus (HPV), every 5 years. Some types of HPV increase your risk of cervical cancer. Testing for HPV may also be done on women of any age with unclear Pap test results.  Other health care providers may not recommend any screening for nonpregnant women who are considered low risk for pelvic cancer and who do not have symptoms. Ask your health care provider if a screening pelvic exam is right for you.  If you have had past treatment for cervical cancer or a condition that could lead to cancer, you need Pap tests and screening for cancer for at least 20 years after your treatment. If Pap tests have been discontinued, your risk factors (such as having a new sexual partner) need to be reassessed to determine if screening should resume. Some women have medical problems that increase the chance of getting cervical cancer. In these cases, your health care provider may recommend more frequent screening and Pap tests.  Colorectal Cancer  This type of cancer can be detected and often prevented.  Routine colorectal cancer screening usually begins at 49 years of age and continues through 49 years of  age.  Your health care provider may recommend screening at an earlier age if you have risk factors for colon cancer.  Your health care provider may also recommend using home test kits to check for hidden blood in the stool.  A small camera at the end of a tube can be used to examine your colon directly (sigmoidoscopy or colonoscopy). This is done to check for the earliest forms of colorectal cancer.  Routine screening usually begins at age 10.  Direct examination of the colon should be repeated every 5-10 years through 49 years of age. However, you may need to be screened more often if early forms of precancerous polyps or small growths are found.  Skin Cancer  Check your skin from head to toe regularly.  Tell your health care provider about any new moles or changes in moles, especially if there is a change in a mole's shape or color.  Also tell your health care provider if you have a mole that is larger than the size of a pencil eraser.  Always use  sunscreen. Apply sunscreen liberally and repeatedly throughout the day.  Protect yourself by wearing long sleeves, pants, a wide-brimmed hat, and sunglasses whenever you are outside.  Heart disease, diabetes, and high blood pressure  High blood pressure causes heart disease and increases the risk of stroke. High blood pressure is more likely to develop in: ? People who have blood pressure in the high end of the normal range (130-139/85-89 mm Hg). ? People who are overweight or obese. ? People who are African American.  If you are 44-32 years of age, have your blood pressure checked every 3-5 years. If you are 40 years of age or older, have your blood pressure checked every year. You should have your blood pressure measured twice-once when you are at a hospital or clinic, and once when you are not at a hospital or clinic. Record the average of the two measurements. To check your blood pressure when you are not at a hospital or clinic, you  can use: ? An automated blood pressure machine at a pharmacy. ? A home blood pressure monitor.  If you are between 62 years and 73 years old, ask your health care provider if you should take aspirin to prevent strokes.  Have regular diabetes screenings. This involves taking a blood sample to check your fasting blood sugar level. ? If you are at a normal weight and have a low risk for diabetes, have this test once every three years after 49 years of age. ? If you are overweight and have a high risk for diabetes, consider being tested at a younger age or more often. Preventing infection Hepatitis B  If you have a higher risk for hepatitis B, you should be screened for this virus. You are considered at high risk for hepatitis B if: ? You were born in a country where hepatitis B is common. Ask your health care provider which countries are considered high risk. ? Your parents were born in a high-risk country, and you have not been immunized against hepatitis B (hepatitis B vaccine). ? You have HIV or AIDS. ? You use needles to inject street drugs. ? You live with someone who has hepatitis B. ? You have had sex with someone who has hepatitis B. ? You get hemodialysis treatment. ? You take certain medicines for conditions, including cancer, organ transplantation, and autoimmune conditions.  Hepatitis C  Blood testing is recommended for: ? Everyone born from [birth year] through 1967. ? Anyone with known risk factors for hepatitis C.  Sexually transmitted infections (STIs)  You should be screened for sexually transmitted infections (STIs) including gonorrhea and chlamydia if: ? You are sexually active and are younger than 49 years of age. ? You are older than 49 years of age and your health care provider tells you that you are at risk for this type of infection. ? Your sexual activity has changed since you were last screened and you are at an increased risk for chlamydia or gonorrhea. Ask your  health care provider if you are at risk.  If you do not have HIV, but are at risk, it may be recommended that you take a prescription medicine daily to prevent HIV infection. This is called pre-exposure prophylaxis (PrEP). You are considered at risk if: ? You are sexually active and do not regularly use condoms or know the HIV status of your partner(s). ? You take drugs by injection. ? You are sexually active with a partner who has HIV.  Talk with your health  care provider about whether you are at high risk of being infected with HIV. If you choose to begin PrEP, you should first be tested for HIV. You should then be tested every 3 months for as long as you are taking PrEP. Pregnancy  If you are premenopausal and you may become pregnant, ask your health care provider about preconception counseling.  If you may become pregnant, take 400 to 800 micrograms (mcg) of folic acid every day.  If you want to prevent pregnancy, talk to your health care provider about birth control (contraception). Osteoporosis and menopause  Osteoporosis is a disease in which the bones lose minerals and strength with aging. This can result in serious bone fractures. Your risk for osteoporosis can be identified using a bone density scan.  If you are 11 years of age or older, or if you are at risk for osteoporosis and fractures, ask your health care provider if you should be screened.  Ask your health care provider whether you should take a calcium or vitamin D supplement to lower your risk for osteoporosis.  Menopause may have certain physical symptoms and risks.  Hormone replacement therapy may reduce some of these symptoms and risks. Talk to your health care provider about whether hormone replacement therapy is right for you. Follow these instructions at home:  Schedule regular health, dental, and eye exams.  Stay current with your immunizations.  Do not use any tobacco products including cigarettes, chewing  tobacco, or electronic cigarettes.  If you are pregnant, do not drink alcohol.  If you are breastfeeding, limit how much and how often you drink alcohol.  Limit alcohol intake to no more than 1 drink per day for nonpregnant women. One drink equals 12 ounces of beer, 5 ounces of wine, or 1 ounces of hard liquor.  Do not use street drugs.  Do not share needles.  Ask your health care provider for help if you need support or information about quitting drugs.  Tell your health care provider if you often feel depressed.  Tell your health care provider if you have ever been abused or do not feel safe at home. This information is not intended to replace advice given to you by your health care provider. Make sure you discuss any questions you have with your health care provider. Document Released: 06/30/2011 Document Revised: 05/22/2016 Document Reviewed: 09/18/2015 Elsevier Interactive Patient Education  [employer].

## 2018-07-05 NOTE — Procedures (Signed)
PROCEDURE NOTE:  Ultrasound Guided: Injection: Right 1st CMC joint , Intra-articular Images were obtained and interpreted by myself, Gaspar Bidding, DO  Images have been saved and stored to PACS system. Images obtained on: GE S7 Ultrasound machine    ULTRASOUND FINDINGS:  Degenerative spurring of the first [facility] joint. Tendon glide with the first dorsal compartment as well as the first common flexor tendon.  Median nerve measures 0.08 cm consistent with normal morphology.  DESCRIPTION OF PROCEDURE:  The patient's clinical condition is marked by substantial pain and/or significant functional disability. Other conservative therapy has not provided relief, is contraindicated, or not appropriate. There is a reasonable likelihood that injection will significantly improve the patient's pain and/or functional impairment.   After discussing the risks, benefits and expected outcomes of the injection and all questions were reviewed and answered, the patient wished to undergo the above named procedure.  Verbal consent was obtained.  The ultrasound was used to identify the target structure and adjacent neurovascular structures. The skin was then prepped in sterile fashion and the target structure was injected under direct visualization using sterile technique as below:  Single injection performed as below: PREP: Alcohol and Ethel Chloride APPROACH:direct, single injection, 25g 1.5 in. INJECTATE: 0.5 cc 1% lidocaine, 0.5 cc 0.5% Marcaine and 0.5 cc 40mg /mL DepoMedrol ASPIRATE: None DRESSING: Band-Aid  Post procedural instructions including recommending icing and warning signs for infection were reviewed.    This procedure was well tolerated and there were no complications.   IMPRESSION: Succesful Ultrasound Guided: Injection

## 2018-07-05 NOTE — Progress Notes (Signed)
I acted as a Neurosurgeon for [employer], PA-C [employer], LPN  Subjective:    Joyce Cline is a 47 y.o. female and is here for a comprehensive physical exam.   HPI  There are no preventive care reminders to display for this patient.  Acute Concerns: Rectal bleeding -- 8 months - 1 year. No pain. Only has with BM about once a week. Last colonoscopy was 4-5 years ago, thinks that she had "internal hemorrhoids." Dizziness -- if she stands up too fast, gets lightheaded easily. Doesn't endorse that she is spinning or the room is spinning. No SOB or palpitations. No pre-syncope. Drinks 100 ounces of water daily.  Vaginal pain -- since she fell off of a stool a few weeks, has had a "burning sensation". Pain is getting better. Has infrequent intercourse (partner is long-distance.)  Chronic Issues: Non-ischemic cardiomyopathy -- evaluated by Dr. Dietrich Pates in June 2018, by pt's report, LV function has normalized. She is overdue for follow-up with Dr. Tenny Craw. Hypertension -- Currently taking Norvasc 5 mg and Carvedilol  25 mg. At home blood pressure readings are: very intermittent. Patient denies chest pain, SOB, blurred vision, dizziness, unusual headaches, lower leg swelling. Patient is compliant with medication. Denies excessive caffeine intake, stimulant usage, excessive alcohol intake, or increase in salt consumption. BP Readings from Last 3 Encounters:  07/05/18 106/70  06/29/18 130/80  04/13/18 118/72  HLD and Hyperglycemia -- Lovaza 2 grams daily and 10 mg pravachol daily. Also has been on creon (although not taking as scheduled because she cannot afford it) and pioglitazone as well -- currently managed by Dr. Romero Belling Lipid Panel     Component Value Date/Time   CHOL 234 (H) 04/13/2018 0818   CHOL 243 (H) 03/16/2018 0830   TRIG (H) 04/13/2018 0818    565.0 Triglyceride is over 400; calculations on Lipids are invalid.   TRIG 2,318 09/22/2016   HDL 42.40  04/13/2018 0818   HDL 45 03/16/2018 0830   CHOLHDL 6 04/13/2018 0818   VLDL UNABLE TO CALCULATE IF TRIGLYCERIDE OVER 400 mg/dL 16/09/9603 5409   LDLCALC Comment 03/16/2018 0830   LDLDIRECT 90.0 04/13/2018 0818   Lab Results  Component Value Date   HGBA1C 4.9 04/13/2018  Chronic pancreatitis --  Vegan diet, limits alcohol, currently ordered for Creon but does not take on a regular basis B12 deficiency -- history of this, will re-check labs today  Health Maintenance: Immunizations -- UTD Colonoscopy -- N/A Mammogram -- 2 years ago, pt will go and send report PAP -- being done today Diet -- mostly vegan Caffeine intake -- coffee daily Exercise -- as able Weight -- Weight: 167 lb (75.8 kg)  Mood -- overall good Weight history: Wt Readings from Last 10 Encounters:  07/05/18 167 lb (75.8 kg)  06/29/18 167 lb (75.8 kg)  04/13/18 171 lb (77.6 kg)  02/05/18 168 lb 12.8 oz (76.6 kg)  01/26/18 172 lb 6.4 oz (78.2 kg)  12/25/17 167 lb (75.8 kg)  10/13/17 165 lb 12.8 oz (75.2 kg)  09/02/17 164 lb (74.4 kg)  07/07/17 159 lb (72.1 kg)  06/05/17 159 lb 12.8 oz (72.5 kg)   No LMP recorded. (Menstrual status: IUD).  Depression screen PHQ 2/9 07/05/2018  Decreased Interest 0  Down, Depressed, Hopeless 0  PHQ - 2 Score 0     Other providers/specialists: Dr. Everardo All -- endo Dr. Tenny Craw -- cards Dr. Marina Goodell -- GI   PMHx, SurgHx, SocialHx, Medications, and Allergies were reviewed in  the Visit Navigator and updated as appropriate.   Past Medical History:  Diagnosis Date  . Anemia   . Anxiety   . B12 deficiency 06/02/2017  . Chronic pancreatitis (HCC)   . History of blood transfusion   . HLD (hyperlipidemia)   . Hypertension   . NICM (nonischemic cardiomyopathy) (HCC)   . Splenic hemorrhage   . Viral myocarditis      Past Surgical History:  Procedure Laterality Date  . CESAREAN SECTION    . CHOLECYSTECTOMY    . ESOPHAGOGASTRODUODENOSCOPY N/A 05/08/2017   Procedure:  ESOPHAGOGASTRODUODENOSCOPY (EGD);  Surgeon: Hilarie Fredrickson, MD;  Location: [facility];  Service: Endoscopy;  Laterality: N/A;  . KNEE SURGERY Right 1988  . WISDOM TOOTH EXTRACTION Bilateral 1990     Family History  Problem Relation Age of Onset  . Uterine cancer Maternal Grandmother   . Hypertension Mother   . Polycythemia Father   . Cancer Maternal Grandfather        type unknown  . Diabetes Paternal Grandmother   . Esophageal cancer Paternal Grandfather   . Hyperlipidemia Neg Hx     Social History   Tobacco Use  . Smoking status: Current Every Day Smoker    Types: Cigarettes  . Smokeless tobacco: Never Used  . Tobacco comment: 2 per day  Substance Use Topics  . Alcohol use: Yes    Alcohol/week: 3.0 oz    Types: 5 Glasses of wine per week    Comment: 1-2 per day  . Drug use: No    Review of Systems:   Review of Systems  Constitutional: Negative for chills, fever, malaise/fatigue and weight loss.  HENT: Negative for hearing loss, sinus pain and sore throat.   Respiratory: Negative for cough and hemoptysis.   Cardiovascular: Negative for chest pain, palpitations, leg swelling and PND.  Gastrointestinal: Negative for abdominal pain, constipation, diarrhea, heartburn, nausea and vomiting.  Genitourinary: Negative for dysuria, frequency and urgency.       Vaginal burning   Musculoskeletal: Negative for back pain, myalgias and neck pain.  Skin: Negative for itching and rash.  Neurological: Positive for dizziness. Negative for tingling, seizures and headaches.  Endo/Heme/Allergies: Negative for polydipsia.  Psychiatric/Behavioral: Negative for depression. The patient is not nervous/anxious.     Objective:   BP 106/70 (BP Location: Left Arm, Patient Position: Sitting, Cuff Size: Normal)   Pulse 70   Temp 98.2 F (36.8 C) (Oral)   Ht 5' 2.5" (1.588 m)   Wt 167 lb (75.8 kg)   SpO2 97%   BMI 30.06 kg/m  Body mass index is 30.06 kg/m.   General Appearance:     Alert, cooperative, no distress, appears stated age  Head:    Normocephalic, without obvious abnormality, atraumatic  Eyes:    PERRL, conjunctiva/corneas clear, EOM's intact, fundi    benign, both eyes  Ears:    Normal TM's and external ear canals, both ears  Nose:   Nares normal, septum midline, mucosa normal, no drainage    or sinus tenderness  Throat:   Lips, mucosa, and tongue normal; teeth and gums normal  Neck:   Supple, symmetrical, trachea midline, no adenopathy;    thyroid:  no enlargement/tenderness/nodules; no carotid   bruit or JVD  Back:     Symmetric, no curvature, ROM normal, no CVA tenderness  Lungs:     Clear to auscultation bilaterally, respirations unlabored  Chest Wall:    No tenderness or deformity   Heart:  Regular rate and rhythm, S1 and S2 normal, no murmur, rub   or gallop  Breast Exam:    No tenderness, masses, or nipple abnormality  Abdomen:     Soft, non-tender, bowel sounds active all four quadrants,    no masses, no organomegaly  Genitalia:    Normal female without lesion, discharge or tenderness; external hemorrhoid present -- flesh colored; no evidence of thrombosis  Extremities:   Extremities normal, atraumatic, no cyanosis or edema  Pulses:   2+ and symmetric all extremities  Skin:   Skin color, texture, turgor normal, no rashes or lesions; bruising to bilateral knees  Lymph nodes:   Cervical, supraclavicular, and axillary nodes normal  Neurologic:   CNII-XII intact, normal strength, sensation and reflexes    throughout   EKG tracing is personally reviewed.  EKG notes NSR.  No acute changes.   Assessment/Plan:   Problem List Items Addressed This Visit      Cardiovascular and Mediastinum   NICM (nonischemic cardiomyopathy) (HCC)    Continue Coreg. Needs yearly follow-up with Dr. Tenny Craw. EKG tracing is personally reviewed.  EKG notes NSR.  No acute changes.        Hypertension    Well-controlled. Continue Coreg and Amlodopine. Needs follow-up with  Dietrich Pates.        Digestive   Chronic pancreatitis (HCC)    Non-compliant with creon 2/2 cost. Discussed using patient care resources to reduce costs, she is looking into the savings card. Due for follow-up with GI.        Other   Hypertriglyceridemia    Followed by Dr. Everardo All. Currently on Lovaza and Pravachol.      Anemia, chronic disease   Relevant Orders   CBC   B12 deficiency    Re-check labs today.      Relevant Orders   Vitamin B12    Other Visit Diagnoses    Encounter for general adult medical examination with abnormal findings    -  Primary Today patient counseled on age appropriate routine health concerns for screening and prevention, each reviewed and up to date or declined. Immunizations reviewed and up to date or declined. Labs ordered and reviewed. Risk factors for depression reviewed and negative. Hearing function and visual acuity are intact. ADLs screened and addressed as needed. Functional ability and level of safety reviewed and appropriate. Education, counseling and referrals performed based on assessed risks today. Patient provided with a copy of personalized plan for preventive services.    Rectal bleeding     Suspect hemorrhoid related, however she does have significant GI history, will have her follow-up with GI as she is due for this anyways.  I did give her a prescription for Anusol suppositories.   Relevant Orders   Comprehensive metabolic panel   Dizziness     EKG was normal.  No red flags on exam.  Neuro exam was benign.  Will check labs.  She is due for cardiology evaluation for her one-year follow-up of her cardiomyopathy.  I asked that she continue to work on hydration and follow-up with cardiologist and mention this during visit.   Relevant Orders   Comprehensive metabolic panel   EKG 12-Lead (Completed)   Urinalysis, Routine w reflex microscopic   Vaginal pain     Will screen for yeast and BV.  Also check a urine today.  No red flags on exam  today. If symptoms persist, consider pelvic floor PT.   Relevant Orders   Cytology - PAP  Urinalysis, Routine w reflex microscopic   Obesity Continue to work on diet and exercise as able.  Pap smear for cervical cancer screening       Relevant Orders   Cytology - PAP        Well Adult Exam: Labs ordered: Yes. Patient counseling was done. See below for items discussed. Discussed the patient's BMI.  The BMI BMI is not in the acceptable range; BMI management plan is completed Follow up in 6 months. Breast cancer screening: mammogram list given. Cervical cancer screening: performed today.   Patient Counseling: [x]    Nutrition: Stressed importance of moderation in sodium/caffeine intake, saturated fat and cholesterol, caloric balance, sufficient intake of fresh fruits, vegetables, fiber, calcium, iron, and 1 mg of folate supplement per day (for females capable of pregnancy).  [x]    Stressed the importance of regular exercise.   [x]    Substance Abuse: Discussed cessation/primary prevention of tobacco, alcohol, or other drug use; driving or other dangerous activities under the influence; availability of treatment for abuse.   [x]    Injury prevention: Discussed safety belts, safety helmets, smoke detector, smoking near bedding or upholstery.   [x]    Sexuality: Discussed sexually transmitted diseases, partner selection, use of condoms, avoidance of unintended pregnancy  and contraceptive alternatives.  [x]    Dental health: Discussed importance of regular tooth brushing, flossing, and dental visits.  [x]    Health maintenance and immunizations reviewed. Please refer to Health maintenance section.    [name], PA-C [place] [employer] [facility]

## 2018-07-05 NOTE — Progress Notes (Signed)
Joyce Cline. Joyce Cline Sports [facility] [phone]  Joyce Cline - 47 y.o. female MRN [id number]  Date of birth: July 07, 1971  Visit Date: 07/05/2018  PCP: Joyce Motto, PA   Referred by: Joyce Cline, [place]  Scribe(s) for today's visit: [name], CMA  SUBJECTIVE:  Joyce Cline is here for Initial Assessment (R wrist pain) .  Referred by: Joyce Motto, PA  Her R wrist pain symptoms INITIALLY: Began about a yr ago but has gotten worse over the past 2 months. No known injury or trauma. 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  Described as mild aching and shooting pain, nonradiating Worsened with movement.  Improved with rest, holding her hand.  Additional associated symptoms include: She reports ongoing swelling in the wrist/hand. She feels like her thumb is less mobile. She feels popping in the thumb when reaching/grasping objects. Pain is constant and can get worse at times. She reports some numbess around the R thumb. She has noticed a lot of stiffness around the wrist when first getting out of bed.  She c/o sensitivity at both elbows, R>L. She denies swelling around the elbow.     At this time symptoms are worsening compared to onset, pain occurs more often and is more severe.  She has been taking Advil with minimal relief. PCP recommended cool water soaks and compression, she has not tried this yet.   No recent XR of the R hand/wrist.    REVIEW OF SYSTEMS: Denies night time disturbances. Denies fevers, chills, or night sweats. Denies unexplained weight loss. Denies personal history of cancer. Denies changes in bowel or bladder habits. Reports recent unreported falls - fell last weekend from bar stool, injured L knee. . Denies new or worsening dyspnea or wheezing. Denies headaches or dizziness.  Reports numbness, tingling or weakness  In the extremities.  Denies dizziness or presyncopal  episodes Denies lower extremity edema    HISTORY & PERTINENT PRIOR DATA:  Prior History reviewed and updated per electronic medical record.  Significant/pertinent history, findings, studies include:  reports that she has been smoking cigarettes.  She has never used smokeless tobacco. Recent Labs    04/13/18 0818  HGBA1C 4.9   ** Controlled Substance Contract Signed ** DO   [place] providers: Dr. Myrtis Cline Internal Medicine Dr. Tempie Cline Gastroenterologist Dr Joyce Cline Cardiologist No problems updated.  OBJECTIVE:  VS:  HT:5' 2.5" (158.8 cm)   WT:167 lb (75.8 kg)  BMI:30.04    BP:106/70  HR:70bpm  TEMP: ( )  RESP:97 %   PHYSICAL EXAM: Constitutional: WDWN, Non-toxic appearing. Psychiatric: Alert & appropriately interactive.  Not depressed or anxious appearing. Respiratory: No increased work of breathing.  Trachea Midline Eyes: Pupils are equal.  EOM intact without nystagmus.  No scleral icterus  Vascular Exam: warm to touch no edema  upper extremity neuro exam: unremarkable  MSK Exam: Right hand and wrist is a small amount of bossing over the right thenar eminence.  There is no thenar atrophy.  Minimal pain with axial loading circumduction there is some mild crepitation.  Negative Finkelstein testing.  No pain with carpal tunnel compression test, Phalen's.  Normal Tinel's.  ASSESSMENT & PLAN:   1. Right wrist pain   2. Primary osteoarthritis of first carpometacarpal joint of right hand     PLAN: Symptoms are consistent with CMC arthritis with a small joint effusion appreciated on ultrasound today.  Ultrasound-guided injection performed today and she will follow-up  as needed for repeat injections.  If any lack of improvement further diagnostic evaluation with plain film x-rays will be obtained.  Follow-up: Return if symptoms worsen or fail to improve.      Please see additional documentation for Objective, Assessment and Plan sections. Pertinent additional  documentation may be included in corresponding procedure notes, imaging studies, problem based documentation and patient instructions. Please see these sections of the encounter for additional information regarding this visit.  CMA/ATC served as Neurosurgeon during this visit. History, Physical, and Plan performed by medical provider. Documentation and orders reviewed and attested to.      Joyce Mews, DO    [place] [facility] Physician

## 2018-07-05 NOTE — Assessment & Plan Note (Signed)
Followed by Dr. Everardo All. Currently on Lovaza and Pravachol.

## 2018-07-05 NOTE — Patient Instructions (Addendum)

## 2018-07-05 NOTE — Assessment & Plan Note (Signed)
Recheck labs today. 

## 2018-07-05 NOTE — Assessment & Plan Note (Signed)
Well-controlled. Continue Coreg and Amlodopine. Needs follow-up with Dietrich Pates.

## 2018-07-05 NOTE — Assessment & Plan Note (Addendum)
Continue Coreg. Needs yearly follow-up with Dr. Tenny Craw. EKG tracing is personally reviewed.  EKG notes NSR.  No acute changes.

## 2018-07-05 NOTE — Assessment & Plan Note (Signed)
Non-compliant with creon 2/2 cost. Discussed using patient care resources to reduce costs, she is looking into the savings card. Due for follow-up with GI.

## 2018-07-06 ENCOUNTER — Other Ambulatory Visit: Payer: Self-pay | Admitting: Physician Assistant

## 2018-07-06 LAB — CYTOLOGY - PAP
Bacterial vaginitis: NEGATIVE
CANDIDA VAGINITIS: NEGATIVE
Diagnosis: NEGATIVE
HPV: NOT DETECTED

## 2018-07-06 MED ORDER — HYDROCORTISONE 2.5 % RE CREA: 1.0000 "application " | TOPICAL_CREAM | Freq: Two times a day (BID) | RECTAL | 0 refills | Status: DC

## 2018-07-06 NOTE — Addendum Note (Signed)
Addended by: Haynes Bast on: 07/06/2018 09:41 AM   Modules accepted: Orders

## 2018-07-14 ENCOUNTER — Ambulatory Visit: Payer: No Typology Code available for payment source | Admitting: Physician Assistant

## 2018-10-13 ENCOUNTER — Ambulatory Visit: Payer: Self-pay | Admitting: Endocrinology

## 2018-10-20 ENCOUNTER — Other Ambulatory Visit: Payer: Self-pay | Admitting: Endocrinology

## 2018-10-21 ENCOUNTER — Encounter: Payer: Self-pay | Admitting: Physician Assistant

## 2018-10-21 ENCOUNTER — Other Ambulatory Visit: Payer: Self-pay

## 2018-10-21 MED ORDER — AMLODIPINE BESYLATE 5 MG PO TABS: 5.0000 mg | ORAL_TABLET | Freq: Every day | ORAL | 1 refills | Status: DC

## 2018-10-28 ENCOUNTER — Telehealth: Payer: Self-pay | Admitting: Physician Assistant

## 2018-10-28 NOTE — Telephone Encounter (Unsigned)
Copied from CRM [id number]. Topic: Quick Communication - Rx Refill/Question >> Oct 28, 2018  8:49 AM Gaynelle Adu wrote: Medication: pravastatin (PRAVACHOL) 10 MG tablet     Has the patient contacted their pharmacy? yes  Preferred Pharmacy (with phone number or street name): [facility] - [place], [place] - [address] [phone] (Phone) [phone] (Fax)    Agent: Please be advised that RX refills may take up to 3 business days. We ask that you follow-up with your pharmacy.

## 2018-10-28 NOTE — Telephone Encounter (Signed)
Ok to fill 

## 2018-10-28 NOTE — Telephone Encounter (Signed)
See note

## 2018-10-29 ENCOUNTER — Other Ambulatory Visit: Payer: Self-pay

## 2018-10-29 ENCOUNTER — Telehealth: Payer: Self-pay | Admitting: Endocrinology

## 2018-10-29 MED ORDER — PRAVASTATIN SODIUM 10 MG PO TABS: 10.0000 mg | ORAL_TABLET | Freq: Every day | ORAL | 1 refills | Status: DC

## 2018-10-29 NOTE — Telephone Encounter (Signed)
Per protocol, Rx has been refilled for 30 day supply with 1 refill.

## 2018-10-29 NOTE — Telephone Encounter (Signed)
Endocrinology seems to be managing this. Needs to go to [facility].

## 2018-10-29 NOTE — Telephone Encounter (Signed)
Patient called stating they are out of refills on pravastatin (PRAVACHOL) 10 MG tablet. Please Advice. [facility] [place]

## 2018-10-29 NOTE — Telephone Encounter (Signed)
Called l/m ok per DPR to call endo for refill.

## 2018-11-18 ENCOUNTER — Ambulatory Visit (INDEPENDENT_AMBULATORY_CARE_PROVIDER_SITE_OTHER): Payer: No Typology Code available for payment source | Admitting: Sports Medicine

## 2018-11-18 ENCOUNTER — Ambulatory Visit: Payer: Self-pay

## 2018-11-18 ENCOUNTER — Encounter: Payer: Self-pay | Admitting: Sports Medicine

## 2018-11-18 VITALS — BP 140/90 | HR 80 | Ht 62.5 in | Wt 167.6 lb

## 2018-11-18 DIAGNOSIS — M1811 Unilateral primary osteoarthritis of first carpometacarpal joint, right hand: Secondary | ICD-10-CM

## 2018-11-18 DIAGNOSIS — Z23 Encounter for immunization: Secondary | ICD-10-CM | POA: Diagnosis not present

## 2018-11-18 DIAGNOSIS — M25531 Pain in right wrist: Secondary | ICD-10-CM

## 2018-11-18 MED ORDER — DICLOFENAC SODIUM 2 % TD SOLN: 1.0000 "application " | Freq: Two times a day (BID) | TRANSDERMAL | 0 refills | Status: AC

## 2018-11-18 MED ORDER — DICLOFENAC SODIUM 2 % TD SOLN: 1.0000 "application " | Freq: Two times a day (BID) | TRANSDERMAL | 2 refills | Status: AC

## 2018-11-18 NOTE — Procedures (Signed)
PROCEDURE NOTE:  Ultrasound Guided: Injection: Right 1st CMC joint Images were obtained and interpreted by myself, Gaspar Bidding, DO  Images have been saved and stored to PACS system. Images obtained on: GE S7 Ultrasound machine    ULTRASOUND FINDINGS:  Marked degenerative change with synovial cyst that is serpiginous along the flexor carpi radialis  DESCRIPTION OF PROCEDURE:  The patient's clinical condition is marked by substantial pain and/or significant functional disability. Other conservative therapy has not provided relief, is contraindicated, or not appropriate. There is a reasonable likelihood that injection will significantly improve the patient's pain and/or functional impairment.   After discussing the risks, benefits and expected outcomes of the injection and all questions were reviewed and answered, the patient wished to undergo the above named procedure.  Verbal consent was obtained.  The ultrasound was used to identify the target structure and adjacent neurovascular structures. The skin was then prepped in sterile fashion and the target structure was injected under direct visualization using sterile technique as below:  Single injection performed as below: PREP: Alcohol and Ethel Chloride APPROACH:direct, single injection, 25g 1.5 in. INJECTATE: 0.5 cc 1% lidocaine and 0.5 cc 80mg /mL DepoMedrol ASPIRATE: None DRESSING: Band-Aid  Post procedural instructions including recommending icing and warning signs for infection were reviewed.    This procedure was well tolerated and there were no complications.   IMPRESSION: Succesful Ultrasound Guided: Injection

## 2018-11-18 NOTE — Progress Notes (Signed)
Joyce Cline. Joyce Cline [facility] at [facility] [phone]  Joyce Cline - 47 y.o. female MRN [id number]  Date of birth: November 05, 1971  Visit Date: 11/18/2018  PCP: [name], PA   Referred by: [name], PA  Scribe(s) for today's visit: Christoper Fabian, LAT, ATC  SUBJECTIVE:  Joyce Cline is here for Follow-up (R wrist and thumb) .  Referred by: [name], PA  Her R wrist pain symptoms INITIALLY: Began about a yr ago but has gotten worse over the past 2 months. No known injury or trauma. 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  Described as mild aching and shooting pain, nonradiating Worsened with movement.  Improved with rest, holding her hand.  Additional associated symptoms include: She reports ongoing swelling in the wrist/hand. She feels like her thumb is less mobile. She feels popping in the thumb when reaching/grasping objects. Pain is constant and can get worse at times. She reports some numbess around the R thumb. She has noticed a lot of stiffness around the wrist when first getting out of bed.  She c/o sensitivity at both elbows, R>L. She denies swelling around the elbow.     At this time symptoms are worsening compared to onset, pain occurs more often and is more severe.  She has been taking Advil with minimal relief. PCP recommended cool water soaks and compression, she has not tried this yet.   11/18/2018: Compared to the last office visit on 07/05/18, her previously described R wrist and thumb symptoms are worsening.  She states that she had about 3-3.5 months.  She reports that her pain now is worse than it was before her initial visit in July 2019.  She notes pain, swelling and decreased motion in her 1st CMC joint. Current symptoms are moderate in the R 1st [facility] joint  & are radiating to R radial wrist. She had a L thumb injection on 07/05/18.  She was using some CBD cream but has run out of this.  No  recent XR of the R hand/wrist.   REVIEW OF SYSTEMS: Denies night time disturbances. Denies fevers, chills, or night sweats. Denies unexplained weight loss. Denies personal history of cancer. Denies changes in bowel or bladder habits. Denies any recent unreported falls. Denies new or worsening dyspnea or wheezing. Denies headaches or dizziness.  Reports numbness, tingling or weakness  In the extremities - in L scapula Denies dizziness or presyncopal episodes Denies lower extremity edema    HISTORY:  Prior history reviewed and updated per electronic medical record.  Social History   Occupational History  . Occupation: customer experience  Tobacco Use  . Smoking status: Current Every Day Smoker    Types: Cigarettes  . Smokeless tobacco: Never Used  . Tobacco comment: 2 per day  Substance and Sexual Activity  . Alcohol use: Yes    Alcohol/week: 5.0 standard drinks    Types: 5 Glasses of wine per week    Comment: 1-2 per day  . Drug use: No  . Sexual activity: Yes    Birth control/protection: IUD   Social History   Social History Narrative   Moved here about 6 months ago   Daughter lives here   Work -- Engineer, drilling [employer]     DATA OBTAINED & REVIEWED:   Recent Labs    04/13/18 0818  HGBA1C 4.9   No problems updated. .   OBJECTIVE:  VS:  HT:5' 2.5" (158.8  cm)   WT:167 lb 9.6 oz (76 kg)  BMI:30.15    BP:140/90  HR:80bpm  TEMP: ( )  RESP:96 %   PHYSICAL EXAM: CONSTITUTIONAL: Well-developed, Well-nourished and In no acute distress PSYCHIATRIC: Alert & appropriately interactive. and Not depressed or anxious appearing. RESPIRATORY: No increased work of breathing and Trachea Midline EYES: Pupils are equal., EOM intact without nystagmus. and No scleral icterus.  VASCULAR EXAM: Warm and well perfused NEURO: unremarkable  MSK Exam: Right hand  Well aligned, no significant deformity. No overlying skin changes. moderate TTP over  the 1st [facility]   RANGE OF MOTION & STRENGTH  limited abduction and opposition   SPECIALITY TESTING:  no pain with carpal tunnel compression test  slight swelling over the thenar eminence  no pain over A1 pulley,    ASSESSMENT   1. Primary osteoarthritis of first carpometacarpal joint of right hand   2. Right wrist pain   3. Need for immunization against influenza     PLAN:  Pertinent additional documentation may be included in corresponding procedure notes, imaging studies, problem based documentation and patient instructions.  Procedures:  . US Guided Injection per procedure note  Medications:  Meds ordered this encounter  Medications  . Diclofenac Sodium (PENNSAID) 2 % SOLN    Sig: Place 1 application onto the skin 2 (two) times daily for 1 day.    Dispense:  8 g    Refill:  0  . Diclofenac Sodium (PENNSAID) 2 % SOLN    Sig: Place 1 application onto the skin 2 (two) times daily.    Dispense:  112 g    Refill:  2    Home Phone      [phone] Mobile          [phone]    Discussion/Instructions: No problem-specific Assessment & Plan notes found for this encounter.  . consider EXOS thumb spika . voltaren gel . Discussed red flag symptoms that warrant earlier emergent evaluation and patient voices understanding. . Activity modifications and the importance of avoiding exacerbating activities (limiting pain to no more than a 4 / 10 during or following activity) recommended and discussed.  Follow-up:  . Return in about 4 weeks (around 12/16/2018) for consider EXOS thumb spika.  . At follow up will plan: possible referall to Ortho Hand. Will need X-rays at f/u of hand     CMA/ATC served as scribe during this visit. History, Physical, and Plan performed by medical provider. Documentation and orders reviewed and attested to.      [name], DO    [place] [facility] Physician

## 2018-11-18 NOTE — Patient Instructions (Addendum)
You had an injection today.  Things to be aware of after injection are listed below: . You may experience no significant improvement or even a slight worsening in your symptoms during the first 24 to 48 hours.  After that we expect your symptoms to improve gradually over the next 2 weeks for the medicine to have its maximal effect.  You should continue to have improvement out to 6 weeks after your injection. . Dr. Berline Chough recommends icing the site of the injection for 20 minutes  1-2 times the day of your injection . You may shower but no swimming, tub bath or Jacuzzi for 24 hours. . If your bandage falls off this does not need to be replaced.  It is appropriate to remove the bandage after 4 hours. . You may resume light activities as tolerated unless otherwise directed per Dr. Berline Chough during your visit  POSSIBLE STEROID SIDE EFFECTS:  Side effects from injectable steroids tend to be less than when taken orally however you may experience some of the symptoms listed below.  If experienced these should only last for a short period of time. Change in menstrual flow  Edema (swelling)  Increased appetite Skin flushing (redness)  Skin rash/acne  Thrush (oral) Yeast vaginitis    Increased sweating  Depression Increased blood glucose levels Cramping and leg/calf  Euphoria (feeling happy)  POSSIBLE PROCEDURE SIDE EFFECTS: The side effects of the injection are usually fairly minimal however if you may experience some of the following side effects that are usually self-limited and will is off on their own.  If you are concerned please feel free to call the office with questions:  Increased numbness or tingling  Nausea or vomiting  Swelling or bruising at the injection site   Please call our office if if you experience any of the following symptoms over the next week as these can be signs of infection:   Fever greater than 100.59F  Significant swelling at the injection site  Significant redness or drainage  from the injection site  If after 2 weeks you are continuing to have worsening symptoms please call our office to discuss what the next appropriate actions should be including the potential for a return office visit or other diagnostic testing.   Pennsaid instructions: You have been given a sample/prescription for Pennsaid, a topical medication.     You are to apply this gel to your injured body part twice daily (morning and evening).   A little goes a long way so you can use about a pea-sized amount for each area.   Spread this small amount over the area into a thin film and let it dry.   Be sure that you do not rub the gel into your skin for more than 10 or 15 seconds otherwise it can irritate you skin.    Once you apply the gel, please do not put any other lotion or clothing in contact with that area for 30 minutes to allow the gel to absorb into your skin.   Some people are sensitive to the medication and can develop a sunburn-like rash.  If you have only mild symptoms it is okay to continue to use the medication but if you have any breakdown of your skin you should discontinue its use and please let us know.   If you have been written a prescription for Pennsaid, you will receive a pump bottle of this topical gel through a mail order pharmacy.  The instructions on the bottle  will say to apply two pumps twice a day which may be too much gel for your particular area so use the pea-sized amount as your guide.   Instructions for Duexis, Pennsaid and Vimovo:  Your prescription will be filled through a participating [facility] mail order pharmacy.  You will receive a phone call or text from one of the participating pharmacies which can be located in any state in the [place].  You must communicate directly with them to have this medication filled.  When the pharmacy contacts you, they will need your mailing address (for shipment of the medication) andy they will need payment  information if you have a copay (typically no more than $10). If you have not heard from them 2-3 days after your appointment with Dr. Berline Chough, contact [facility] directly at [phone].

## 2018-12-16 ENCOUNTER — Ambulatory Visit (INDEPENDENT_AMBULATORY_CARE_PROVIDER_SITE_OTHER): Payer: No Typology Code available for payment source

## 2018-12-16 ENCOUNTER — Ambulatory Visit (INDEPENDENT_AMBULATORY_CARE_PROVIDER_SITE_OTHER): Payer: No Typology Code available for payment source | Admitting: Sports Medicine

## 2018-12-16 ENCOUNTER — Encounter: Payer: Self-pay | Admitting: Sports Medicine

## 2018-12-16 ENCOUNTER — Ambulatory Visit (INDEPENDENT_AMBULATORY_CARE_PROVIDER_SITE_OTHER): Payer: No Typology Code available for payment source | Admitting: Endocrinology

## 2018-12-16 ENCOUNTER — Encounter: Payer: Self-pay | Admitting: Endocrinology

## 2018-12-16 VITALS — BP 120/82 | HR 75 | Ht 62.5 in | Wt 171.4 lb

## 2018-12-16 VITALS — BP 142/88 | HR 79 | Temp 97.7°F | Ht 63.0 in | Wt 174.0 lb

## 2018-12-16 DIAGNOSIS — E781 Pure hyperglyceridemia: Secondary | ICD-10-CM | POA: Diagnosis not present

## 2018-12-16 DIAGNOSIS — M1811 Unilateral primary osteoarthritis of first carpometacarpal joint, right hand: Secondary | ICD-10-CM

## 2018-12-16 MED ORDER — PIOGLITAZONE HCL 45 MG PO TABS: 45.0000 mg | ORAL_TABLET | Freq: Every day | ORAL | 3 refills | Status: DC

## 2018-12-16 NOTE — Patient Instructions (Addendum)
blood tests are requested for you today.  We'll let you know about the results.  Please continue the same medications.   Another option is to add "Jardiance."  Your blood pressure is high today.  Please continue to follow this up with your primary care provider.   Please come back for a follow-up appointment in 6 months.

## 2018-12-16 NOTE — Progress Notes (Signed)
Subjective:    Patient ID: Joyce Cline, female    DOB: 04/09/1971, [age] y.o.   MRN: [id number]  HPI Pt returns for f/u of hypertriglyceridemia (dx'ed 2013; onl possible secondary cause found was B-blocker; he has had several episodes of pancreatitis (most recently in mid-2018, when TG were over 2000); she was rx'ed with lipophoresis; since then, she takes creon, pioglitazone, lovaza, pravachol, and fenofibrate; she seldom consumes alcohol; she has seen dietician).  She has intermitt moderate pain at the epigastric area, but no assoc n/v.   Past Medical History:  Diagnosis Date  . Anemia   . Anxiety   . B12 deficiency 06/02/2017  . Chronic pancreatitis (HCC)   . History of blood transfusion   . HLD (hyperlipidemia)   . Hypertension   . NICM (nonischemic cardiomyopathy) (HCC)   . Splenic hemorrhage   . Viral myocarditis     Past Surgical History:  Procedure Laterality Date  . CESAREAN SECTION    . CHOLECYSTECTOMY    . ESOPHAGOGASTRODUODENOSCOPY N/A 05/08/2017   Procedure: ESOPHAGOGASTRODUODENOSCOPY (EGD);  Surgeon: Hilarie FredricksonPerry, John N, MD;  Location: [facility];  Service: Endoscopy;  Laterality: N/A;  . KNEE SURGERY Right 1988  . WISDOM TOOTH EXTRACTION Bilateral 1990    Social History   Socioeconomic History  . Marital status: Single    Spouse name: Not on file  . Number of children: 2  . Years of education: Not on file  . Highest education level: Not on file  Occupational History  . Occupation: customer experience  Social Needs  . Financial resource strain: Not on file  . Food insecurity:    Worry: Not on file    Inability: Not on file  . Transportation needs:    Medical: Not on file    Non-medical: Not on file  Tobacco Use  . Smoking status: Current Every Day Smoker    Types: Cigarettes  . Smokeless tobacco: Never Used  . Tobacco comment: 2 per day  Substance and Sexual Activity  . Alcohol use: Yes    Alcohol/week: 5.0 standard drinks    Types: 5  Glasses of wine per week    Comment: 1-2 per day  . Drug use: No  . Sexual activity: Yes    Birth control/protection: I.U.D.  Lifestyle  . Physical activity:    Days per week: Not on file    Minutes per session: Not on file  . Stress: Not on file  Relationships  . Social connections:    Talks on phone: Not on file    Gets together: Not on file    Attends religious service: Not on file    Active member of club or organization: Not on file    Attends meetings of clubs or organizations: Not on file    Relationship status: Not on file  . Intimate partner violence:    Fear of current or ex partner: Not on file    Emotionally abused: Not on file    Physically abused: Not on file    Forced sexual activity: Not on file  Other Topics Concern  . Not on file  Social History Narrative   Moved here about 6 months ago   Daughter lives here   Work -- Engineer, drillingcustomer experience manager for [employer]    Current Outpatient Medications on File Prior to Visit  Medication Sig Dispense Refill  . amLODipine (NORVASC) 5 MG tablet Take 1 tablet (5 mg total) by mouth daily. 90 tablet 1  .  carvedilol (COREG) 25 MG tablet TAKE 1 TABLET BY MOUTH 2 TIMES DAILY 180 tablet 0  . CREON 36000 units CPEP capsule Take 2 capsules by mouth 3 (three) times daily.  11  . Cyanocobalamin (VITAMIN B-12) 6000 MCG SUBL Place 2,000 mcg under the tongue daily.     . Diclofenac Sodium (PENNSAID) 2 % SOLN Place 1 application onto the skin 2 (two) times daily. 112 g 2  . fenofibrate 160 MG tablet Take 1 tablet by mouth daily. 90 tablet 3  . levonorgestrel (MIRENA) 20 MCG/24HR IUD 1 each by Intrauterine route once. IUD inserted 2015, needs to be removed in 2020.    . Multiple Vitamin (MULTIVITAMIN WITH MINERALS) TABS tablet Take 1 tablet by mouth daily.    Marland Kitchen omega-3 acid ethyl esters (LOVAZA) 1 g capsule Take 2 capsules (2 g total) 2 (two) times daily by mouth. 120 capsule 11  . pravastatin (PRAVACHOL) 10 MG tablet Take 1  tablet (10 mg total) by mouth daily. 30 tablet 1  . traMADol (ULTRAM) 50 MG tablet Take 1 tablet (50 mg total) by mouth every 6 (six) hours as needed for moderate pain. 30 tablet 0  . Turmeric 500 MG CAPS Take 500 mg by mouth 2 (two) times daily.     No current facility-administered medications on file prior to visit.     Allergies  Allergen Reactions  . Amoxil [Amoxicillin] Rash    Family History  Problem Relation Age of Onset  . Uterine cancer Maternal Grandmother   . Hypertension Mother   . Polycythemia Father   . Cancer Maternal Grandfather        type unknown  . Diabetes Paternal Grandmother   . Esophageal cancer Paternal Grandfather   . Hyperlipidemia Neg Hx     BP (!) 142/88 (BP Location: Right Arm, Patient Position: Sitting, Cuff Size: Large)   Pulse 79   Temp 97.7 F (36.5 C) (Oral)   Ht 5\' 3"  (1.6 m)   Wt 174 lb (78.9 kg)   SpO2 96%   BMI 30.82 kg/m    Review of Systems Denies sob.  She has gained a few lbs.      Objective:   Physical Exam VITAL SIGNS:  See vs page. GENERAL: no distress.  Legs: no edema.      Assessment & Plan:  HTN: is noted today Hypertriglyceridemia: due for recheck abd pain: this limits rx options.   Patient Instructions  blood tests are requested for you today.  We'll let you know about the results.  Please continue the same medications.   Another option is to add "Jardiance."  Your blood pressure is high today.  Please continue to follow this up with your primary care provider.   Please come back for a follow-up appointment in 6 months.

## 2018-12-16 NOTE — Progress Notes (Signed)
[name]. [name] Sports Medicine [facility] at [facility] [phone]  Devlyn Khatoon - 47 y.o. female MRN [id number]  Date of birth: January 03, 1971  Visit Date:   PCP: [name], PA   Referred by: [name], [place]   SUBJECTIVE:  Chief Complaint  Patient presents with  . f/u R wrist and thumb pain    weakness, n/t, mild swelling. Sx are improving. Denies popping/clicking, c/o decreased ROM.     HPI: Patient presents for follow-up of right thumb pain.  She is developed some numbness over the palmar aspect of her distal phalanx.  Otherwise no sick she continues to have some swelling over the [facility] joint has weakness with grip strength.  She has had to modify how she opens jars due to the pain she has.  She is undergone injection x2 over the past 6 months and is intermittent relief with this.  Pennsaid has been helpful mainly at nighttime but with any type of use of her hand she is having pain.  She is interested in a thumb spica splint today.  REVIEW OF SYSTEMS: She has some numbness and tingling -12 point review of systems.  HISTORY:  Prior history reviewed and updated per electronic medical record.  Social History   Occupational History  . Occupation: customer experience  Tobacco Use  . Smoking status: Current Every Day Smoker    Types: Cigarettes  . Smokeless tobacco: Never Used  . Tobacco comment: 2 per day  Substance and Sexual Activity  . Alcohol use: Yes    Alcohol/week: 5.0 standard drinks    Types: 5 Glasses of wine per week    Comment: 1-2 per day  . Drug use: No  . Sexual activity: Yes    Birth control/protection: I.U.D.   Social History   Social History Narrative   Moved here about 6 months ago   Daughter lives here   Work -- Engineer, drilling for [employer]      DATA OBTAINED & REVIEWED:  Recent Labs    12/18/17 1117 04/13/18 0818 07/05/18 0915  HGBA1C  --  4.9  --   CALCIUM 10.0  --   10.1  AST 25  --  19  ALT 23  --  26   No problems updated. No specialty comments available.  OBJECTIVE:  VS:  HT:5' 2.5" (158.8 cm)   WT:171 lb 6.4 oz (77.7 kg)  BMI:30.83    BP:120/82  HR:75bpm  TEMP: ( )  RESP:97 %   PHYSICAL EXAM: Adult female.  No.  Alert and appropriate.  Her right thumb has generalized bossing of the CMC joint.  She has marked pain with palpation directly over this.  Mild pain but no exacerbation of symptoms with carpal tunnel compression test.  Negative Tinel's.  She has minimal pain with axial load and circumduction of the thumb Marked pain with any type of ulnar deviation or radial deviation of the CMC joint.  Good flexion tension MCP and IP joint of the thumb.  Grip strength is intact and painful.   ASSESSMENT  1. Primary osteoarthritis of first carpometacarpal joint of right hand     PLAN:  Pertinent additional documentation may be included in corresponding procedure notes, imaging studies, problem based documentation and patient instructions.  Procedures:  Custom EXOS thumb spica splint.  Medications:  No orders of the defined types were placed in this encounter.  Discussion/Instructions: No problem-specific Assessment & Plan notes found for this encounter.  Discussed appropriate use of both heat and ice with the patient today.  Discussed red flag symptoms that warrant earlier emergent evaluation and patient voices understanding. Activity modifications and the importance of avoiding exacerbating activities (limiting pain to no more than a 4 / 10 during or following activity) recommended and discussed. >50% of this 25 minutes minute visit spent in direct patient counseling and/or coordination of care. Discussion was focused on education regarding the in discussing the pathoetiology and anticipated clinical course of the above condition. X-rays are consistent with osteoarthritis of the [facility] joint.  We will set her up with a custom XO's thumb spica  splint today and have her continue with Pennsaid.  Can consider repeat injections down the road and/or referral to Ortho hand for consideration of CMC suspension If any lack of improvement: consider referral to Orthopedics for [facility] suspension  At follow up will plan : to consider repeat corticosteroid injections Return in about 8 weeks (around 02/10/2019) for consideration of repeat injections.          Andrena MewsMichael D Rigby, DO    [place] [facility] Physician

## 2018-12-20 ENCOUNTER — Other Ambulatory Visit: Payer: Self-pay

## 2018-12-27 ENCOUNTER — Other Ambulatory Visit: Payer: Self-pay | Admitting: Endocrinology

## 2018-12-27 ENCOUNTER — Other Ambulatory Visit: Payer: Self-pay

## 2018-12-27 DIAGNOSIS — E781 Pure hyperglyceridemia: Secondary | ICD-10-CM

## 2018-12-27 MED ORDER — OMEGA-3-ACID ETHYL ESTERS 1 G PO CAPS: 2.0000 g | ORAL_CAPSULE | Freq: Two times a day (BID) | ORAL | 1 refills | Status: DC

## 2019-01-01 ENCOUNTER — Other Ambulatory Visit: Payer: Self-pay | Admitting: Physician Assistant

## 2019-01-03 MED ORDER — CARVEDILOL 25 MG PO TABS: 25.0000 mg | ORAL_TABLET | Freq: Two times a day (BID) | ORAL | 0 refills | Status: DC

## 2019-01-17 ENCOUNTER — Encounter: Payer: Self-pay | Admitting: Physical Therapy

## 2019-01-17 ENCOUNTER — Encounter: Payer: Self-pay | Admitting: Sports Medicine

## 2019-01-17 NOTE — Telephone Encounter (Signed)
See other note

## 2019-01-20 ENCOUNTER — Ambulatory Visit: Payer: Self-pay

## 2019-01-20 ENCOUNTER — Encounter: Payer: Self-pay | Admitting: Sports Medicine

## 2019-01-20 ENCOUNTER — Ambulatory Visit (INDEPENDENT_AMBULATORY_CARE_PROVIDER_SITE_OTHER): Payer: No Typology Code available for payment source | Admitting: Sports Medicine

## 2019-01-20 VITALS — BP 140/92 | HR 82 | Ht 63.0 in | Wt 173.2 lb

## 2019-01-20 DIAGNOSIS — R200 Anesthesia of skin: Secondary | ICD-10-CM | POA: Diagnosis not present

## 2019-01-20 DIAGNOSIS — M25531 Pain in right wrist: Secondary | ICD-10-CM

## 2019-01-20 DIAGNOSIS — M1811 Unilateral primary osteoarthritis of first carpometacarpal joint, right hand: Secondary | ICD-10-CM

## 2019-01-20 NOTE — Progress Notes (Signed)
Joyce Cline. [name] Sports Medicine [facility] at [facility] [phone]  Joyce Cline - 48 y.o. female MRN [id number]  Date of birth: April 13, 1971  Visit Date: January 20, 2019  PCP: [name], [place]   Referred by: [name], [place]  SUBJECTIVE:  Chief Complaint  Patient presents with  . Follow-up    R hand, index finger and thumb pain.  Pennsaid.  Exos thumb spica splint.  R wrist XR - 12/16/18    HPI: Patient presents with worsening right hand symptoms.  She is now having right index finger tingling and hypersensitivity that is mainly on the palmar aspect of her distal phalanx.   Minimal symptoms on the palmar aspect.  Feels as though her right thumb continues to be stuck.  She continues to get swelling numbness and weakness with grip strength.  REVIEW OF SYSTEMS: Denies fevers, chills, recent weight gain or weight loss.  No night sweats. No significant nighttime awakenings due to this issue.  HISTORY:  Prior history reviewed and updated per electronic medical record.  Social History   Occupational History  . Occupation: customer experience  Tobacco Use  . Smoking status: Current Every Day Smoker    Types: Cigarettes  . Smokeless tobacco: Never Used  . Tobacco comment: 2 per day  Substance and Sexual Activity  . Alcohol use: Yes    Alcohol/week: 5.0 standard drinks    Types: 5 Glasses of wine per week    Comment: 1-2 per day  . Drug use: No  . Sexual activity: Yes    Birth control/protection: I.U.D.   Social History   Social History Narrative   Moved here about 6 months ago   Daughter lives here   Work -- Engineer, drilling for [employer]    OBJECTIVE:  VS:  HT:5\' 3"  (160 cm)   WT:173 lb 3.2 oz (78.6 kg)  BMI:30.69    BP:(!) 140/92  HR:82bpm  TEMP: ( )  RESP:93 %   PHYSICAL EXAM: Adult female.  No acute distress.  Alert and appropriate. She is having a moderate amount of swelling within the  first webspace as well as over the [facility] joint.  She does have pain with axial load of the first CMC joint.  Slight amount of improvement with distraction of the joint but this is mild.  Pain with thumb opposition and flexion.  She does have pain with grip strength as well as with carpal tunnel compression test but this is mild and does not cause the reproduction of the symptoms in her finger.   ASSESSMENT   1. Primary osteoarthritis of first carpometacarpal joint of right hand   2. Right wrist pain   3. Finger numbness      PROCEDURES:  None  PLAN:  Pertinent additional documentation may be included in corresponding procedure notes, imaging studies, problem based documentation and patient instructions.  Ultimately MSK ultrasound did show a slight enlargement of the median nerve measuring 0.11 cm however the entire clinical picture does not fit carpal tunnel syndrome although this may be a component of her pain.  She absolutely has severe CMC arthritis and will likely benefit from a suspension arthroplasty however prior to referring her for this I would like to confirm that this is isolated so that if she may require a carpal tunnel intervention this will be known prior to undergoing any type of other intervention for the thumb joint itself.  Activity modifications and the importance of avoiding  exacerbating activities (limiting pain to no more than a 4 / 10 during or following activity) recommended and discussed. Discussed red flag symptoms that warrant earlier emergent evaluation and patient voices understanding.   No orders of the defined types were placed in this encounter.  Lab Orders  No laboratory test(s) ordered today    Imaging Orders     [place] [facility] POCT ULTRASOUND  Referral Orders     Ambulatory referral to Neurology  Return for review of nerve conduction study results.          [name], DO    [place] [facility] Physician

## 2019-01-22 NOTE — Procedures (Signed)
LIMITED MSK ULTRASOUND OF Right hand Images were obtained and interpreted by myself, Gaspar Bidding, DO  Images have been saved and stored to PACS system. Images obtained on: GE S7 Ultrasound machine  FINDINGS:   Right median nerve measures 0.11 cm in surface area consistent with mild carpal tunnel  Marked CMC joint arthritic changes including along the palmar aspect with large osteophytic spurring and a moderate effusion.  IMPRESSION:  1. CMC arthritis, severe 2. Possible right carpal tunnel syndrome

## 2019-02-10 ENCOUNTER — Ambulatory Visit: Payer: No Typology Code available for payment source | Admitting: Sports Medicine

## 2019-02-14 ENCOUNTER — Other Ambulatory Visit: Payer: Self-pay | Admitting: Endocrinology

## 2019-04-02 ENCOUNTER — Other Ambulatory Visit: Payer: Self-pay | Admitting: Endocrinology

## 2019-05-05 ENCOUNTER — Telehealth: Payer: Self-pay | Admitting: Physician Assistant

## 2019-05-05 ENCOUNTER — Telehealth: Payer: Self-pay | Admitting: Endocrinology

## 2019-05-05 NOTE — Telephone Encounter (Signed)
Pt recently moved to [facility] and is needing refills to last her until she is able to get established with a new Endocrinologist in July 2020.   Refill needed: Pravastatin 10mg   If okay to send 90 day supply then will need to go to Mail order - [facility].  If not able to do 90 day supply then can be sent to local pharmacy - [facility] [address] [place] [address]

## 2019-05-05 NOTE — Telephone Encounter (Signed)
Please advise 

## 2019-05-05 NOTE — Telephone Encounter (Signed)
Please refill x 3 months Further refills would have to be considered by new provider

## 2019-05-05 NOTE — Telephone Encounter (Signed)
Copied from CRM [id number]. Topic: Quick Communication - Rx Refill/Question >> May 05, 2019  1:29 PM Maia Petties wrote: Medication: carvedilol (COREG) 25 MG tablet - pt is out of medication and her insurance has changed. Pt has moved to [facility] and has not been able to establish a new physician yet in the area due to the COVID19 pandemic. Pt had a panic attack and went to the ER Tuesday 05/03/2019. Her BP was 229/130. They gave her a short supply of medications but told her to contact PCP for 90 day.  Her insurance requires 90 day supply thru mail order. Updated insurance to [facility] in [place].  Has the patient contacted their pharmacy? Yes - new RX needed Preferred Pharmacy (with phone number or street name): [facility] [phone] [place]ie [place], [place] - [address] [place] [phone] (Phone) [phone] (Fax)

## 2019-05-05 NOTE — Telephone Encounter (Signed)
See note

## 2019-05-05 NOTE — Telephone Encounter (Signed)
Pt said ok to contact thru mychart or by phone.

## 2019-05-06 ENCOUNTER — Other Ambulatory Visit: Payer: Self-pay

## 2019-05-06 DIAGNOSIS — E781 Pure hyperglyceridemia: Secondary | ICD-10-CM

## 2019-05-06 MED ORDER — PRAVASTATIN SODIUM 10 MG PO TABS: 10.0000 mg | ORAL_TABLET | Freq: Every day | ORAL | 0 refills | Status: AC

## 2019-05-06 NOTE — Telephone Encounter (Signed)
MyChart message sent to patient to schedule virtual visit-has not been seen since 06/2018

## 2019-05-06 NOTE — Telephone Encounter (Signed)
pravastatin (PRAVACHOL) 10 MG tablet 90 tablet 0 05/06/2019    Sig - Route: Take 1 tablet (10 mg total) by mouth daily. - Oral   Sent to pharmacy as: pravastatin (PRAVACHOL) 10 MG tablet   Notes to Pharmacy: Future refills will need to be forwarded to new PCP   E-Prescribing Status: Receipt confirmed by pharmacy (05/06/2019 8:08 AM EDT)

## 2019-05-09 ENCOUNTER — Ambulatory Visit (INDEPENDENT_AMBULATORY_CARE_PROVIDER_SITE_OTHER): Payer: BLUE CROSS/BLUE SHIELD | Admitting: Physician Assistant

## 2019-05-09 ENCOUNTER — Encounter: Payer: Self-pay | Admitting: Physician Assistant

## 2019-05-09 VITALS — BP 148/90 | HR 77

## 2019-05-09 DIAGNOSIS — I428 Other cardiomyopathies: Secondary | ICD-10-CM | POA: Diagnosis not present

## 2019-05-09 DIAGNOSIS — I10 Essential (primary) hypertension: Secondary | ICD-10-CM

## 2019-05-09 MED ORDER — AMLODIPINE BESYLATE 5 MG PO TABS: 5.0000 mg | ORAL_TABLET | Freq: Every day | ORAL | 1 refills | Status: AC

## 2019-05-09 MED ORDER — CARVEDILOL 25 MG PO TABS: 25.0000 mg | ORAL_TABLET | Freq: Two times a day (BID) | ORAL | 1 refills | Status: DC

## 2019-05-09 NOTE — Progress Notes (Signed)
Virtual Visit via Video   I connected with Joyce Cline on 05/09/19 at 11:00 AM EDT by a video enabled telemedicine application and verified that I am speaking with the correct person using two identifiers. Location patient: Home Location provider: [facility], Office Persons participating in the virtual visit: Joyce Cline, [name], [place], Corky Mull, LPN  I discussed the limitations of evaluation and management by telemedicine and the availability of in person appointments. The patient expressed understanding and agreed to proceed.  I acted as a Neurosurgeon for [employer], [employer], LPN  Subjective:   HPI:  Hypertension Pt for follow up today on her blood pressure and ED visit. Pt had been out of her medication for two weeks and went to ED on 5/5 due to having headache, blurred vision, fatigue. Pt was given one month supply of her medications Amlodipine and Carvedilol and told to follow up with PCP. Pt has recently relocated to [facility] and has an appointment scheduled with a new provider for July 21st.   Pt has been checking her blood pressure over the past week and it has been ranging 148-175 systolic and 90-100 diastolic. Pt says her symptoms have resolved since back on medication regularly.   Pt denies headaches, dizziness, blurred vision, chest pain, SOB or lower leg edema. Denies excessive caffeine intake, stimulant usage, excessive alcohol intake or increase in salt consumption.  ROS: See pertinent positives and negatives per HPI.  Patient Active Problem List   Diagnosis Date Noted  . Primary osteoarthritis of first carpometacarpal joint of right hand 07/05/2018  . Obesity 07/05/2018  . Hyperglycemia 04/13/2018  . Hypertension 06/02/2017  . Chronic pancreatitis (HCC) 06/02/2017  . B12 deficiency 06/02/2017  . Anemia, chronic disease   . Gastric mass   . Abdominal pain 05/07/2017  . Hypertriglyceridemia 05/07/2017  . NICM  (nonischemic cardiomyopathy) (HCC) 05/07/2017    Social History   Tobacco Use  . Smoking status: Current Every Day Smoker    Types: Cigarettes  . Smokeless tobacco: Never Used  . Tobacco comment: 2 per day  Substance Use Topics  . Alcohol use: Yes    Alcohol/week: 5.0 standard drinks    Types: 5 Glasses of wine per week    Comment: 1-2 per day    Current Outpatient Medications:  .  amLODipine (NORVASC) 5 MG tablet, Take 1 tablet (5 mg total) by mouth daily., Disp: 90 tablet, Rfl: 1 .  carvedilol (COREG) 25 MG tablet, Take 1 tablet (25 mg total) by mouth 2 (two) times daily., Disp: 180 tablet, Rfl: 0 .  Cyanocobalamin (VITAMIN B-12) 6000 MCG SUBL, Place 2,000 mcg under the tongue daily. , Disp: , Rfl:  .  Diclofenac Sodium (PENNSAID) 2 % SOLN, Place 1 application onto the skin 2 (two) times daily. (Patient taking differently: Place 1 application onto the skin as needed. ), Disp: 112 g, Rfl: 2 .  fenofibrate 160 MG tablet, Take 1 tablet by mouth daily., Disp: 90 tablet, Rfl: 3 .  levonorgestrel (MIRENA) 20 MCG/24HR IUD, 1 each by Intrauterine route once. IUD inserted 2015, needs to be removed in 2020., Disp: , Rfl:  .  Multiple Vitamin (MULTIVITAMIN WITH MINERALS) TABS tablet, Take 1 tablet by mouth daily., Disp: , Rfl:  .  omega-3 acid ethyl esters (LOVAZA) 1 g capsule, TAKE 2 CAPSULES BY MOUTH 2 TIMES DAILY, Disp: 60 capsule, Rfl: 1 .  pravastatin (PRAVACHOL) 10 MG tablet, Take 1 tablet (10 mg total) by mouth daily.,  Disp: 90 tablet, Rfl: 0 .  Turmeric 500 MG CAPS, Take 500 mg by mouth 2 (two) times daily., Disp: , Rfl:  .  traMADol (ULTRAM) 50 MG tablet, Take 1 tablet (50 mg total) by mouth every 6 (six) hours as needed for moderate pain. (Patient not taking: Reported on 01/20/2019), Disp: 30 tablet, Rfl: 0  Allergies  Allergen Reactions  . Amoxil [Amoxicillin] Rash    Objective:   Vitals:   05/09/19 1014  BP: (!) 148/90  Pulse: 77    VITALS: Per patient if applicable, see  vitals. GENERAL: Alert, appears well and in no acute distress. HEENT: Atraumatic, conjunctiva clear, no obvious abnormalities on inspection of external nose and ears. NECK: Normal movements of the head and neck. CARDIOPULMONARY: No increased WOB. Speaking in clear sentences. I:E ratio WNL.  MS: Moves all visible extremities without noticeable abnormality. PSYCH: Pleasant and cooperative, well-groomed. Speech normal rate and rhythm. Affect is appropriate. Insight and judgement are appropriate. Attention is focused, linear, and appropriate.  NEURO: CN grossly intact. Oriented as arrived to appointment on time with no prompting. Moves both UE equally.  SKIN: No obvious lesions, wounds, erythema, or cyanosis noted on face or hands.  Assessment and Plan:   [place] was seen today for hypertension.  Diagnoses and all orders for this visit:  Hypertension, unspecified type; NICM (nonischemic cardiomyopathy) (HCC) No red flags on exam. Overall becoming controlled after resuming her medications. She understands the importance of not going without her medications again. She does have new cardiology appointment next week. Has new PCP appt in July -- discussed that we are able to help out until that time if anything arises. Will refill both BP meds for 90 days today.   . Reviewed expectations re: course of current medical issues. . Discussed self-management of symptoms. . Outlined signs and symptoms indicating need for more acute intervention. . Patient verbalized understanding and all questions were answered. Marland Kitchen Health Maintenance issues including appropriate healthy diet, exercise, and smoking avoidance were discussed with patient. . See orders for this visit as documented in the electronic medical record.  I discussed the assessment and treatment plan with the patient. The patient was provided an opportunity to ask questions and all were answered. The patient agreed with the plan and demonstrated an  understanding of the instructions.   The patient was advised to call back or seek an in-person evaluation if the symptoms worsen or if the condition fails to improve as anticipated.   CMA or LPN served as scribe during this visit. History, Physical, and Plan performed by medical provider. The above documentation has been reviewed and is accurate and complete.  [place], [place] 05/09/2019

## 2019-06-23 ENCOUNTER — Ambulatory Visit: Payer: Self-pay | Admitting: Endocrinology

## 2019-11-28 ENCOUNTER — Other Ambulatory Visit: Payer: Self-pay | Admitting: *Deleted

## 2019-11-28 MED ORDER — CARVEDILOL 25 MG PO TABS: 25.0000 mg | ORAL_TABLET | Freq: Two times a day (BID) | ORAL | 0 refills | Status: DC

## 2020-02-18 ENCOUNTER — Other Ambulatory Visit: Payer: Self-pay | Admitting: Physician Assistant

## 2022-09-22 ENCOUNTER — Encounter: Payer: Self-pay | Admitting: *Deleted

## 2022-12-11 ENCOUNTER — Encounter: Payer: Self-pay | Admitting: *Deleted
